# Patient Record
Sex: Female | Born: 1935 | Race: White | Hispanic: No | Smoking: Never smoker
Health system: Southern US, Community
[De-identification: ages and names within clinical notes are randomized; demographics above are authoritative.]

## PROBLEM LIST (undated history)

## (undated) DIAGNOSIS — I495 Sick sinus syndrome: Secondary | ICD-10-CM

## (undated) DIAGNOSIS — R0600 Dyspnea, unspecified: Secondary | ICD-10-CM

## (undated) DIAGNOSIS — E119 Type 2 diabetes mellitus without complications: Secondary | ICD-10-CM

## (undated) DIAGNOSIS — I499 Cardiac arrhythmia, unspecified: Secondary | ICD-10-CM

## (undated) DIAGNOSIS — I639 Cerebral infarction, unspecified: Secondary | ICD-10-CM

## (undated) DIAGNOSIS — Z87898 Personal history of other specified conditions: Secondary | ICD-10-CM

## (undated) DIAGNOSIS — K219 Gastro-esophageal reflux disease without esophagitis: Secondary | ICD-10-CM

## (undated) DIAGNOSIS — R609 Edema, unspecified: Secondary | ICD-10-CM

## (undated) DIAGNOSIS — I10 Essential (primary) hypertension: Secondary | ICD-10-CM

## (undated) HISTORY — PX: SHOULDER ARTHROSCOPY: SHX128

## (undated) HISTORY — PX: BACK SURGERY: SHX140

## (undated) HISTORY — PX: EYE SURGERY: SHX253

---

## 2007-02-15 ENCOUNTER — Emergency Department: Payer: Self-pay | Admitting: Emergency Medicine

## 2009-11-23 ENCOUNTER — Ambulatory Visit: Payer: Self-pay | Admitting: Ophthalmology

## 2009-11-27 ENCOUNTER — Ambulatory Visit: Payer: Self-pay | Admitting: Cardiovascular Disease

## 2009-12-11 ENCOUNTER — Ambulatory Visit: Payer: Self-pay | Admitting: Ophthalmology

## 2011-11-27 ENCOUNTER — Ambulatory Visit: Payer: Self-pay | Admitting: Internal Medicine

## 2012-02-19 ENCOUNTER — Ambulatory Visit: Payer: Self-pay | Admitting: Ophthalmology

## 2012-02-19 DIAGNOSIS — I10 Essential (primary) hypertension: Secondary | ICD-10-CM

## 2012-03-02 ENCOUNTER — Ambulatory Visit: Payer: Self-pay | Admitting: Ophthalmology

## 2012-11-05 ENCOUNTER — Ambulatory Visit: Payer: Self-pay | Admitting: Cardiology

## 2014-10-25 NOTE — Op Note (Signed)
PATIENT NAME:  Misty Trevino, Misty Trevino MR#:  [id number] DATE OF BIRTH:  1921/05/18  DATE OF PROCEDURE:  03/02/2012  PREOPERATIVE DIAGNOSIS: Cataract, right eye.   POSTOPERATIVE DIAGNOSIS:  Cataract, right eye.  PROCEDURE PERFORMED: Extracapsular cataract extraction using phacoemulsification with placement of an Alcon [id number], 24.5-diopter posterior chamber lens, serial [id number].   SURGEON: Loura Back. Gurkirat Basher, M.D.   ANESTHESIA: 4% lidocaine and 0.75% Marcaine in a 50-50 mixture with 10 units/mL of Hylenex added, given as a peribulbar.   ANESTHESIOLOGIST: Dr. Benjamine Mola   COMPLICATIONS: None.   ESTIMATED BLOOD LOSS: Less than 1 mL.   DESCRIPTION OF PROCEDURE:  The patient was brought to the operating room and given a peribulbar block.  The patient was then prepped and draped in the usual fashion.  The vertical rectus muscles were imbricated using 5-0 silk sutures.  These sutures were then clamped to the sterile drapes as bridle sutures.  A limbal peritomy was performed extending two clock hours and hemostasis was obtained with cautery.  A partial thickness scleral groove was made at the surgical limbus and dissected anteriorly in a lamellar dissection using an Alcon crescent knife.  The anterior chamber was entered superonasally with a Superblade and through the lamellar dissection with a 2.6 mm keratome.  DisCoVisc was used to replace the aqueous and a continuous tear capsulorrhexis was carried out.  Hydrodissection and hydrodelineation were carried out with balanced salt and a 27 gauge canula.  The nucleus was rotated to confirm the effectiveness of the hydrodissection.  Phacoemulsification was carried out using a divide-and-conquer technique.  Total ultrasound time was 2 minutes and 2 seconds with an average power of  23.3 percent.  CDE 51.65.  Irrigation/aspiration was used to remove the residual cortex.  DisCoVisc was used to inflate the capsule and the internal incision was enlarged to 3 mm  with the crescent knife.  The intraocular lens was folded and inserted into the capsular bag using the Acrysert delivery system.  Irrigation/aspiration was used to remove the residual DisCoVisc.  Miostat was injected into the anterior chamber through the paracentesis track to inflate the anterior chamber and induce miosis.  The wound was checked for leaks and none were found. The conjunctiva was closed with cautery and the bridle sutures were removed.  Two drops of 0.3% Vigamox were placed on the eye.   An eye shield was placed on the eye.  The patient was discharged to the recovery room in good condition.   ____________________________ Loura Back Alazar Cherian, MD sad:bjt D: 03/02/2012 13:49:53 ET T: 03/02/2012 13:59:56 ET JOB#: [id number]  cc: Remo Lipps A. Reighlynn Swiney, MD, <Dictator> Martie Lee MD ELECTRONICALLY SIGNED 03/06/2012 12:40

## 2015-10-24 ENCOUNTER — Other Ambulatory Visit: Payer: Self-pay | Admitting: Internal Medicine

## 2015-10-24 DIAGNOSIS — G459 Transient cerebral ischemic attack, unspecified: Secondary | ICD-10-CM

## 2015-10-30 ENCOUNTER — Ambulatory Visit
Admission: RE | Admit: 2015-10-30 | Discharge: 2015-10-30 | Disposition: A | Discharge status: Home / Self Care | Payer: Medicare Other | Source: Ambulatory Visit | Attending: Internal Medicine | Admitting: Internal Medicine

## 2015-10-30 DIAGNOSIS — I708 Atherosclerosis of other arteries: Secondary | ICD-10-CM | POA: Insufficient documentation

## 2015-10-30 DIAGNOSIS — G459 Transient cerebral ischemic attack, unspecified: Secondary | ICD-10-CM

## 2016-02-19 ENCOUNTER — Other Ambulatory Visit: Payer: Self-pay | Admitting: Otolaryngology

## 2017-10-29 ENCOUNTER — Other Ambulatory Visit: Payer: Self-pay | Admitting: Internal Medicine

## 2017-10-29 ENCOUNTER — Ambulatory Visit
Admission: RE | Admit: 2017-10-29 | Discharge: 2017-10-29 | Disposition: A | Discharge status: Home / Self Care | Payer: Medicare Other | Source: Ambulatory Visit | Attending: Internal Medicine | Admitting: Internal Medicine

## 2017-10-29 DIAGNOSIS — M85851 Other specified disorders of bone density and structure, right thigh: Secondary | ICD-10-CM | POA: Insufficient documentation

## 2017-10-29 DIAGNOSIS — M25551 Pain in right hip: Secondary | ICD-10-CM | POA: Diagnosis not present

## 2017-10-29 DIAGNOSIS — W19XXXA Unspecified fall, initial encounter: Secondary | ICD-10-CM | POA: Diagnosis not present

## 2017-10-29 DIAGNOSIS — R52 Pain, unspecified: Secondary | ICD-10-CM

## 2017-10-29 DIAGNOSIS — M160 Bilateral primary osteoarthritis of hip: Secondary | ICD-10-CM | POA: Diagnosis not present

## 2018-02-19 ENCOUNTER — Encounter
Admission: RE | Admit: 2018-02-19 | Discharge: 2018-02-19 | Disposition: A | Discharge status: Home / Self Care | Payer: Medicare Other | Source: Ambulatory Visit | Attending: Cardiology | Admitting: Cardiology

## 2018-02-19 ENCOUNTER — Ambulatory Visit
Admission: RE | Admit: 2018-02-19 | Discharge: 2018-02-19 | Disposition: A | Discharge status: Home / Self Care | Payer: Medicare Other | Source: Ambulatory Visit | Attending: Cardiology | Admitting: Cardiology

## 2018-02-19 ENCOUNTER — Other Ambulatory Visit: Payer: Self-pay

## 2018-02-19 DIAGNOSIS — Z01818 Encounter for other preprocedural examination: Secondary | ICD-10-CM | POA: Insufficient documentation

## 2018-02-19 DIAGNOSIS — I480 Paroxysmal atrial fibrillation: Secondary | ICD-10-CM | POA: Diagnosis not present

## 2018-02-19 DIAGNOSIS — J984 Other disorders of lung: Secondary | ICD-10-CM | POA: Diagnosis not present

## 2018-02-19 DIAGNOSIS — I700 Atherosclerosis of aorta: Secondary | ICD-10-CM | POA: Diagnosis not present

## 2018-02-19 DIAGNOSIS — M810 Age-related osteoporosis without current pathological fracture: Secondary | ICD-10-CM | POA: Diagnosis not present

## 2018-02-19 HISTORY — DX: Dyspnea, unspecified: R06.00

## 2018-02-19 HISTORY — DX: Cerebral infarction, unspecified: I63.9

## 2018-02-19 HISTORY — DX: Personal history of other specified conditions: Z87.898

## 2018-02-19 HISTORY — DX: Edema, unspecified: R60.9

## 2018-02-19 HISTORY — DX: Gastro-esophageal reflux disease without esophagitis: K21.9

## 2018-02-19 HISTORY — DX: Type 2 diabetes mellitus without complications: E11.9

## 2018-02-19 HISTORY — DX: Essential (primary) hypertension: I10

## 2018-02-19 HISTORY — DX: Cardiac arrhythmia, unspecified: I49.9

## 2018-02-19 LAB — BASIC METABOLIC PANEL
ANION GAP: 11 (ref 5–15)
BUN: 41 mg/dL — AB (ref 8–23)
CO2: 26 mmol/L (ref 22–32)
Calcium: 9.6 mg/dL (ref 8.9–10.3)
Chloride: 102 mmol/L (ref 98–111)
Creatinine, Ser: 1.14 mg/dL — ABNORMAL HIGH (ref 0.44–1.00)
GFR calc Af Amer: 46 mL/min — ABNORMAL LOW (ref >60)
GFR calc non Af Amer: 40 mL/min — ABNORMAL LOW (ref >60)
GLUCOSE: 191 mg/dL — AB (ref 70–99)
Potassium: 4.6 mmol/L (ref 3.5–5.1)
Sodium: 139 mmol/L (ref 135–145)

## 2018-02-19 LAB — APTT: APTT: 35 s (ref 24–36)

## 2018-02-19 LAB — CBC
HEMATOCRIT: 40.9 % (ref 35.0–47.0)
Hemoglobin: 14 g/dL (ref 12.0–16.0)
MCH: 33.7 pg (ref 26.0–34.0)
MCHC: 34.3 g/dL (ref 32.0–36.0)
MCV: 98.2 fL (ref 80.0–100.0)
Platelets: 309 10*3/uL (ref 150–440)
RBC: 4.16 MIL/uL (ref 3.80–5.20)
RDW: 14 % (ref 11.5–14.5)
WBC: 13.4 10*3/uL — AB (ref 3.6–11.0)

## 2018-02-19 LAB — PROTIME-INR
INR: 1.3
Prothrombin Time: 16.1 seconds — ABNORMAL HIGH (ref 11.4–15.2)

## 2018-02-19 LAB — SURGICAL PCR SCREEN
MRSA, PCR: NEGATIVE
Staphylococcus aureus: POSITIVE — AB

## 2018-02-19 NOTE — Patient Instructions (Signed)
Your procedure is scheduled on:02/25/18 Report to Day Surgery. [facility] second floor To find out your arrival time please call [phone] between 1PM - 3PM on 02/24/18 Remember: Instructions that are not followed completely may result in serious medical risk,  up to and including death, or upon the discretion of your surgeon and anesthesiologist your  surgery may need to be rescheduled.     _X__ 1. Do not eat food after midnight the night before your procedure.                 No gum chewing or hard candies. You may drink clear liquids up to 2 hours                 before you are scheduled to arrive for your surgery- DO not drink clear                 liquids within 2 hours of the start of your surgery.                 Clear Liquids include:  water, apple juice without pulp, clear carbohydrate                 drink such as Clearfast of Gatorade, Black Coffee or Tea (Do not add                 anything to coffee or tea).  __X__2.  On the morning of surgery brush your teeth with toothpaste and water, you                may rinse your mouth with mouthwash if you wish.  Do not swallow any toothpaste of mouthwash.     _X__ 3.  No Alcohol for 24 hours before or after surgery.   _X__ 4.  Do Not Smoke or use e-cigarettes For 24 Hours Prior to Your Surgery.                 Do not use any chewable tobacco products for at least 6 hours prior to                 surgery.  ____  5.  Bring all medications with you on the day of surgery if instructed.   _X  6.  Notify your doctor if there is any change in your medical condition      (cold, fever, infections).     Do not wear jewelry, make-up, hairpins, clips or nail polish. Do not wear lotions, powders, or perfumes. You may wear deodorant. Do not shave 48 hours prior to surgery. Men may shave face and neck. Do not bring valuables to the hospital.    [facility] is not responsible for any belongings or  valuables.  Contacts, dentures or bridgework may not be worn into surgery. Leave your suitcase in the car. After surgery it may be brought to your room. For patients admitted to the hospital, discharge time is determined by your treatment team.   Patients discharged the day of surgery will not be allowed to drive home.   Please read over the following fact sheets that you were given:   Surgical Site Infection Prevention /MRSA  __X__ Take these medicines the morning of surgery with A SIP OF WATER:    1.NONE  2.   3.   4.  5.  6.  ____ Fleet Enema (as directed)   _X Use CHG Soap as directed  ____ Use inhalers  on the day of surgery  __X__ Stop metformin 2 days prior to surgery LAST DOSE OF METFORMIN Sunday NIGHT   ____ Take 1/2 of usual insulin dose the night before surgery. No insulin the morning          of surgery.   _X___ Stop Coumadin/Plavix/aspirin on  LAST DOSE OF ELIQUIS SATURDAY ____ Stop Anti-inflammatories on    ____ Stop supplements until after surgery.    ____ Bring C-Pap to the hospital.

## 2018-02-20 NOTE — Pre-Procedure Instructions (Signed)
Faxed notification of +Staph - MRSA to office.

## 2018-02-24 MED ORDER — CEFAZOLIN SODIUM-DEXTROSE 1-4 GM/50ML-% IV SOLN
1.0000 g | Freq: Once | INTRAVENOUS | Status: AC
  Administered 2018-02-25: 2 g via INTRAVENOUS

## 2018-02-25 ENCOUNTER — Observation Stay: Payer: Medicare Other

## 2018-02-25 ENCOUNTER — Observation Stay
Admission: RE | Admit: 2018-02-25 | Discharge: 2018-02-26 | Disposition: A | Discharge status: Home / Self Care | Payer: Medicare Other | Source: Ambulatory Visit | Attending: Cardiology | Admitting: Cardiology

## 2018-02-25 ENCOUNTER — Ambulatory Visit: Payer: Medicare Other | Admitting: Certified Registered"

## 2018-02-25 ENCOUNTER — Encounter
Admission: RE | Disposition: A | Discharge status: Home / Self Care | Payer: Self-pay | Source: Ambulatory Visit | Attending: Cardiology

## 2018-02-25 ENCOUNTER — Ambulatory Visit: Payer: Medicare Other

## 2018-02-25 ENCOUNTER — Encounter: Payer: Self-pay | Admitting: *Deleted

## 2018-02-25 ENCOUNTER — Other Ambulatory Visit: Payer: Self-pay

## 2018-02-25 DIAGNOSIS — Z7984 Long term (current) use of oral hypoglycemic drugs: Secondary | ICD-10-CM | POA: Diagnosis not present

## 2018-02-25 DIAGNOSIS — Z8673 Personal history of transient ischemic attack (TIA), and cerebral infarction without residual deficits: Secondary | ICD-10-CM | POA: Insufficient documentation

## 2018-02-25 DIAGNOSIS — I495 Sick sinus syndrome: Principal | ICD-10-CM | POA: Insufficient documentation

## 2018-02-25 DIAGNOSIS — I10 Essential (primary) hypertension: Secondary | ICD-10-CM | POA: Insufficient documentation

## 2018-02-25 DIAGNOSIS — I480 Paroxysmal atrial fibrillation: Secondary | ICD-10-CM | POA: Diagnosis not present

## 2018-02-25 DIAGNOSIS — I441 Atrioventricular block, second degree: Secondary | ICD-10-CM | POA: Diagnosis not present

## 2018-02-25 DIAGNOSIS — Z79899 Other long term (current) drug therapy: Secondary | ICD-10-CM | POA: Diagnosis not present

## 2018-02-25 DIAGNOSIS — Z7901 Long term (current) use of anticoagulants: Secondary | ICD-10-CM | POA: Insufficient documentation

## 2018-02-25 DIAGNOSIS — Z8249 Family history of ischemic heart disease and other diseases of the circulatory system: Secondary | ICD-10-CM | POA: Diagnosis not present

## 2018-02-25 DIAGNOSIS — E119 Type 2 diabetes mellitus without complications: Secondary | ICD-10-CM | POA: Diagnosis not present

## 2018-02-25 DIAGNOSIS — E785 Hyperlipidemia, unspecified: Secondary | ICD-10-CM | POA: Insufficient documentation

## 2018-02-25 DIAGNOSIS — Z950 Presence of cardiac pacemaker: Secondary | ICD-10-CM

## 2018-02-25 HISTORY — DX: Sick sinus syndrome: I49.5

## 2018-02-25 HISTORY — PX: PACEMAKER INSERTION: SHX728

## 2018-02-25 LAB — GLUCOSE, CAPILLARY
GLUCOSE-CAPILLARY: 126 mg/dL — AB (ref 70–99)
Glucose-Capillary: 154 mg/dL — ABNORMAL HIGH (ref 70–99)
Glucose-Capillary: 211 mg/dL — ABNORMAL HIGH (ref 70–99)

## 2018-02-25 SURGERY — INSERTION, CARDIAC PACEMAKER
Anesthesia: Choice | Wound class: Clean

## 2018-02-25 MED ORDER — SODIUM CHLORIDE 0.9 % IV SOLN
INTRAVENOUS | Status: DC
  Administered 2018-02-25 (×2): via INTRAVENOUS

## 2018-02-25 MED ORDER — ONDANSETRON HCL 4 MG/2ML IJ SOLN: 4.0000 mg | Freq: Four times a day (QID) | INTRAMUSCULAR | Status: DC | PRN

## 2018-02-25 MED ORDER — LIDOCAINE 1 % OPTIME INJ - NO CHARGE
INTRAMUSCULAR | Status: DC | PRN
  Administered 2018-02-25: 30 mL

## 2018-02-25 MED ORDER — PROPOFOL 10 MG/ML IV BOLUS
INTRAVENOUS | Status: AC
  Filled 2018-02-25: qty 20

## 2018-02-25 MED ORDER — PROPOFOL 500 MG/50ML IV EMUL
INTRAVENOUS | Status: DC | PRN
  Administered 2018-02-25: 25 ug/kg/min via INTRAVENOUS

## 2018-02-25 MED ORDER — CEFAZOLIN SODIUM-DEXTROSE 1-4 GM/50ML-% IV SOLN
1.0000 g | Freq: Four times a day (QID) | INTRAVENOUS | Status: DC
  Administered 2018-02-25 – 2018-02-26 (×2): 1 g via INTRAVENOUS
  Filled 2018-02-25 (×4): qty 50

## 2018-02-25 MED ORDER — FAMOTIDINE 20 MG PO TABS
ORAL_TABLET | ORAL | Status: AC
  Filled 2018-02-25: qty 1

## 2018-02-25 MED ORDER — METOPROLOL SUCCINATE ER 50 MG PO TB24
50.0000 mg | ORAL_TABLET | Freq: Every day | ORAL | Status: DC
  Administered 2018-02-25 – 2018-02-26 (×2): 50 mg via ORAL
  Filled 2018-02-25: qty 2
  Filled 2018-02-25: qty 1

## 2018-02-25 MED ORDER — FAMOTIDINE 20 MG PO TABS
20.0000 mg | ORAL_TABLET | Freq: Once | ORAL | Status: AC
  Administered 2018-02-25: 20 mg via ORAL

## 2018-02-25 MED ORDER — ACETAMINOPHEN 325 MG PO TABS
325.0000 mg | ORAL_TABLET | ORAL | Status: DC | PRN
  Administered 2018-02-26: 650 mg via ORAL
  Filled 2018-02-25: qty 2

## 2018-02-25 MED ORDER — METFORMIN HCL 850 MG PO TABS
850.0000 mg | ORAL_TABLET | Freq: Every day | ORAL | Status: DC
  Administered 2018-02-26: 850 mg via ORAL
  Filled 2018-02-25: qty 1

## 2018-02-25 MED ORDER — CEFAZOLIN SODIUM-DEXTROSE 1-4 GM/50ML-% IV SOLN
INTRAVENOUS | Status: AC
  Filled 2018-02-25: qty 50

## 2018-02-25 MED ORDER — SODIUM CHLORIDE 0.9 % IV SOLN
INTRAVENOUS | Status: DC
  Administered 2018-02-25: 13:00:00 via INTRAVENOUS

## 2018-02-25 MED ORDER — SODIUM CHLORIDE 0.9 % IV SOLN
Freq: Once | INTRAVENOUS | Status: DC
  Filled 2018-02-25: qty 2

## 2018-02-25 MED ORDER — FENTANYL CITRATE (PF) 100 MCG/2ML IJ SOLN: 25.0000 ug | INTRAMUSCULAR | Status: DC | PRN

## 2018-02-25 MED ORDER — METOPROLOL TARTRATE 5 MG/5ML IV SOLN
INTRAVENOUS | Status: DC | PRN
  Administered 2018-02-25: 2.5 mg via INTRAVENOUS

## 2018-02-25 MED ORDER — ONDANSETRON HCL 4 MG/2ML IJ SOLN: 4.0000 mg | Freq: Once | INTRAMUSCULAR | Status: DC | PRN

## 2018-02-25 SURGICAL SUPPLY — 37 items
BAG DECANTER FOR FLEXI CONT (MISCELLANEOUS) ×3 IMPLANT
BRUSH SCRUB EZ  4% CHG (MISCELLANEOUS) ×2
BRUSH SCRUB EZ 4% CHG (MISCELLANEOUS) ×1 IMPLANT
CABLE SURG 12 DISP A/V CHANNEL (MISCELLANEOUS) ×3 IMPLANT
CANISTER SUCT 1200ML W/VALVE (MISCELLANEOUS) ×3 IMPLANT
CHLORAPREP W/TINT 26ML (MISCELLANEOUS) ×3 IMPLANT
COVER LIGHT HANDLE STERIS (MISCELLANEOUS) ×6 IMPLANT
COVER MAYO STAND STRL (DRAPES) ×3 IMPLANT
DRAPE C-ARM XRAY 36X54 (DRAPES) ×3 IMPLANT
DRSG TEGADERM 4X4.75 (GAUZE/BANDAGES/DRESSINGS) ×3 IMPLANT
DRSG TELFA 4X3 1S NADH ST (GAUZE/BANDAGES/DRESSINGS) ×3 IMPLANT
ELECT REM PT RETURN 9FT ADLT (ELECTROSURGICAL) ×3
ELECTRODE REM PT RTRN 9FT ADLT (ELECTROSURGICAL) ×1 IMPLANT
GLOVE BIO SURGEON STRL SZ7.5 (GLOVE) ×3 IMPLANT
GLOVE BIO SURGEON STRL SZ8 (GLOVE) ×3 IMPLANT
GOWN STRL REUS W/ TWL LRG LVL3 (GOWN DISPOSABLE) ×1 IMPLANT
GOWN STRL REUS W/ TWL XL LVL3 (GOWN DISPOSABLE) ×1 IMPLANT
GOWN STRL REUS W/TWL LRG LVL3 (GOWN DISPOSABLE) ×2
GOWN STRL REUS W/TWL XL LVL3 (GOWN DISPOSABLE) ×2
IMMOBILIZER SHDR MD LX WHT (SOFTGOODS) ×3 IMPLANT
IMMOBILIZER SHDR XL LX WHT (SOFTGOODS) IMPLANT
INTRO PACEMAKR LEAD 9FR 13CM (INTRODUCER) ×3
INTRO PACEMKR SHEATH II 7FR (MISCELLANEOUS) ×6
INTRODUCER PACEMKR LD 9FR 13CM (INTRODUCER) ×1 IMPLANT
INTRODUCER PACEMKR SHTH II 7FR (MISCELLANEOUS) ×2 IMPLANT
IPG PACE AZUR XT DR MRI W1DR01 (Pacemaker) ×1 IMPLANT
IV NS 500ML (IV SOLUTION) ×2
IV NS 500ML BAXH (IV SOLUTION) ×1 IMPLANT
KIT TURNOVER KIT A (KITS) ×3 IMPLANT
LABEL OR SOLS (LABEL) ×3 IMPLANT
LEAD CAPSURE NOVUS 5076-52CM (Lead) ×3 IMPLANT
MARKER SKIN DUAL TIP RULER LAB (MISCELLANEOUS) ×3 IMPLANT
PACE AZURE XT DR MRI W1DR01 (Pacemaker) ×3 IMPLANT
PACEMAKER LEAD ATRL (Lead) ×3 IMPLANT
PACK PACE INSERTION (MISCELLANEOUS) ×3 IMPLANT
PAD ONESTEP ZOLL R SERIES ADT (MISCELLANEOUS) ×3 IMPLANT
SUT SILK 0 SH 30 (SUTURE) ×9 IMPLANT

## 2018-02-25 NOTE — Transfer of Care (Signed)
Immediate Anesthesia Transfer of Care Note  Patient: Misty Trevino  Procedure(s) Performed: INSERTION PACEMAKER- INITIAL DUAL CHAMBER (N/A )  Patient Location: PACU  Anesthesia Type:MAC  Level of Consciousness: awake, alert  and oriented  Airway & Oxygen Therapy: Patient Spontanous Breathing  Post-op Assessment: Report given to RN and Post -op Vital signs reviewed and stable  Post vital signs: Reviewed and stable  Last Vitals:  Vitals Value Taken Time  BP    Temp    Pulse    Resp    SpO2      Last Pain:  Vitals:   02/25/18 1215  TempSrc: Oral  PainSc: 0-No pain         Complications: No apparent anesthesia complications

## 2018-02-25 NOTE — Anesthesia Post-op Follow-up Note (Signed)
Anesthesia QCDR form completed.        

## 2018-02-25 NOTE — H&P (Signed)
Jump to Section ? Discontinued MedicationsDocument InformationEncounter DetailsLast Filed Vital SignsPatient ContactsPatient DemographicsPatient InstructionsPlan of TreatmentProgress NotesReason for VisitSocial HistoryVisit Diagnoses [name] Encounter Summary, generated on Aug. 21, 2019August 21, 2019 Printout Information  Document Contents Document Received Date Document Source Organization  Office Visit Aug. 21, 2019August 21, 2019 [name]  Patient Demographics - 84 y.o. Female; born [birth year]   Patient Address Communication Language Race / Ethnicity Marital Status  [place], [place] [address] [phone] (Home) [email or web address] English (Preferred) White / Not Hispanic or Latino Widowed  Reason for Visit   Reason Comments  Follow-up holter-HR low  Encounter Details   Date Type Department Care Team Description  02/16/2018 Office Visit [facility]  [place], [place] [address]  [phone]  Misty Trevino, [place]  [facility]  [place], [place] [address]  [phone]  [phone] (Fax)  Paroxysmal atrial fibrillation (CMS-HCC) (Primary Dx);  Essential (primary) hypertension;  Type 2 diabetes mellitus without complication, without long-term current use of insulin (CMS-HCC);  Bradycardia;  Mobitz type 2 second degree atrioventricular block  Social History - documented as of this encounter  Tobacco Use Types Packs/Day Years Used Date  Never Smoker      Smokeless Tobacco: Never Used      Alcohol Use Drinks/Week oz/Week Comments  No      Sex Assigned at [employer] Date Recorded  Not on file    Job Start Date Occupation Industry  Not on file Not on file Not on file   Travel History Travel Start Travel End  No recent travel history available.    Last Filed Vital Signs - documented in this encounter  Vital Sign Reading Time Taken  Comments  Blood Pressure 120/60 02/16/2018 3:29 PM EDT   Pulse 39 02/16/2018 3:29 PM EDT   Temperature - -   Respiratory Rate - -   Oxygen Saturation 96% 02/16/2018 3:29 PM EDT   Inhaled Oxygen Concentration - -   Weight 54.9 kg (121 lb) 02/16/2018 3:29 PM EDT   Height 160 cm (5\' 3" ) 02/16/2018 3:29 PM EDT   Body Mass Index 21.43 02/16/2018 3:29 PM EDT   Patient Instructions - documented in this encounter  Patient Instructions Misty Cowman, MD - 02/16/2018 2:30 PM EDT   Patient Education    DASH Diet: Care Instructions Your Care Instructions  The DASH diet is an eating plan that can help lower your blood pressure. DASH stands for Dietary Approaches to Stop Hypertension. Hypertension is high blood pressure. The DASH diet focuses on eating foods that are high in calcium, potassium, and magnesium. These nutrients can lower blood pressure. The foods that are highest in these nutrients are fruits, vegetables, low-fat dairy products, nuts, seeds, and legumes. But taking calcium, potassium, and magnesium supplements instead of eating foods that are high in those nutrients does not have the same effect. The DASH diet also includes whole grains, fish, and poultry. The DASH diet is one of several lifestyle changes your doctor may recommend to lower your high blood pressure. Your doctor may also want you to decrease the amount of sodium in your diet. Lowering sodium while following the DASH diet can lower blood pressure even further than just the DASH diet alone. Follow-up care is a key part of your treatment and safety. Be sure to make and go to all appointments, and call your doctor if you are having problems. It's also a good  idea to know your test results and keep a list of the medicines you take. How can you care for yourself at home? Following the DASH diet  Eat 4 to 5 servings of fruit each day. A serving is 1 medium-sized piece of fruit,  cup chopped or canned fruit, 1/4  cup dried fruit, or 4 ounces ( cup) of fruit juice. Choose fruit more often than fruit juice.  Eat 4 to 5 servings of vegetables each day. A serving is 1 cup of lettuce or raw leafy vegetables,  cup of chopped or cooked vegetables, or 4 ounces ( cup) of vegetable juice. Choose vegetables more often than vegetable juice.  Get 2 to 3 servings of low-fat and fat-free dairy each day. A serving is 8 ounces of milk, 1 cup of yogurt, or 1  ounces of cheese.  Eat 6 to 8 servings of grains each day. A serving is 1 slice of bread, 1 ounce of dry cereal, or  cup of cooked rice, pasta, or cooked cereal. Try to choose whole-grain products as much as possible.  Limit lean meat, poultry, and fish to 2 servings each day. A serving is 3 ounces, about the size of a deck of cards.  Eat 4 to 5 servings of nuts, seeds, and legumes (cooked dried beans, lentils, and split peas) each week. A serving is 1/3 cup of nuts, 2 tablespoons of seeds, or  cup of cooked beans or peas.  Limit fats and oils to 2 to 3 servings each day. A serving is 1 teaspoon of vegetable oil or 2 tablespoons of salad dressing.  Limit sweets and added sugars to 5 servings or less a week. A serving is 1 tablespoon jelly or jam,  cup sorbet, or 1 cup of lemonade.  Eat less than 2,300 milligrams (mg) of sodium a day. If you limit your sodium to 1,500 mg a day, you can lower your blood pressure even more. Tips for success  Start small. Do not try to make dramatic changes to your diet all at once. You might feel that you are missing out on your favorite foods and then be more likely to not follow the plan. Make small changes, and stick with them. Once those changes become habit, add a few more changes.  Try some of the following: ? Make it a goal to eat a fruit or vegetable at every meal and at snacks. This will make it easy to get the recommended amount of fruits and vegetables each day. ? Try yogurt topped with fruit and nuts for a snack or  healthy dessert. ? Add lettuce, tomato, cucumber, and onion to sandwiches. ? Combine a ready-made pizza crust with low-fat mozzarella cheese and lots of vegetable toppings. Try using tomatoes, squash, spinach, broccoli, carrots, cauliflower, and onions. ? Have a variety of cut-up vegetables with a low-fat dip as an appetizer instead of chips and dip. ? Sprinkle sunflower seeds or chopped almonds over salads. Or try adding chopped walnuts or almonds to cooked vegetables. ? Try some vegetarian meals using beans and peas. Add garbanzo or kidney beans to salads. Make burritos and tacos with mashed pinto beans or black beans. Where can you learn more? Log in to your [facility] MyChart account at [email or web address] and click on top menu option "Health" then select "Search Medical Library". Enter [phone] in the search box and click the magnify glass to learn more about "DASH Diet: Care Instructions." Current as of: January 26, 2017 Content Version: 12.1  2006-2019  Healthwise, Incorporated. Care instructions adapted under license by your healthcare professional. If you have questions about a medical condition or this instruction, always ask your healthcare professional. [place] any warranty or liability for your use of this information.     Patient Education    Learning About High Cholesterol What is high cholesterol?  Cholesterol is a type of fat in your blood. It is needed for many body functions, such as making new cells. Cholesterol is made by your body. It also comes from food you eat. If you have too much cholesterol, it starts to build up in your arteries. This is called hardening of the arteries, or atherosclerosis. High cholesterol raises your risk of a heart attack and stroke. There are different types of cholesterol. LDL is the "bad" cholesterol. High LDL can raise your risk for heart disease, heart attack, and stroke. HDL is the "good" cholesterol. High HDL is  linked with a lower risk for heart disease, heart attack, and stroke. Your cholesterol levels help your doctor find out your risk for having a heart attack or stroke. How can you prevent high cholesterol? A heart-healthy lifestyle can help you prevent high cholesterol. This lifestyle helps lower your risk for a heart attack and stroke.  Eat heart-healthy foods. ? Eat fruits, vegetables, whole grains (like oatmeal), dried beans and peas, nuts and seeds, soy products (like tofu), and fat-free or low-fat dairy products. ? Replace butter, margarine, and hydrogenated or partially hydrogenated oils with olive and canola oils. (Canola oil margarine without trans fat is fine.) ? Replace red meat with fish, poultry, and soy protein (like tofu). ? Limit processed and packaged foods like chips, crackers, and cookies.  Be active. Exercise can improve your cholesterol level. Get at least 30 minutes of exercise on most days of the week. Walking is a good choice. You also may want to do other activities, such as running, swimming, cycling, or playing tennis or team sports.  Stay at a healthy weight. Lose weight if you need to.  Don't smoke. If you need help quitting, talk to your doctor about stop-smoking programs and medicines. These can increase your chances of quitting for good. How is high cholesterol treated? The goal of treatment is to reduce your chances of having a heart attack or stroke. The goal is not to lower your cholesterol numbers only.  You may make lifestyle changes, such as eating healthy foods, not smoking, losing weight, and being more active.  You may have to take medicine. Follow-up care is a key part of your treatment and safety. Be sure to make and go to all appointments, and call your doctor if you are having problems. It's also a good idea to know your test results and keep a list of the medicines you take. Where can you learn more? Log in to your [facility] MyChart account at  [email or web address] and click on top menu option "Health" then select "Search Medical Library". Enter [phone] in the search box and click the magnify glass to learn more about "Learning About High Cholesterol." Current as of: January 26, 2017 Content Version: 12.1  2006-2019 Healthwise, Incorporated. Care instructions adapted under license by your healthcare professional. If you have questions about a medical condition or this instruction, always ask your healthcare professional. [place] any warranty or liability for your use of this information.      Electronically signed by Misty Cowman, MD at 02/16/2018 4:27 PM EDT    Progress Notes - documented in  this encounter  Misty Cowman, MD - 02/16/2018 2:30 PM EDT Formatting of this note might be different from the original. Established Patient Visit   Chief Complaint: Chief Complaint  Patient presents with  . Follow-up  holter-HR low  Date of Service: 02/16/2018 Date of Birth: July 30, 1920 PCP: Albina Billet, MD  History of Present Illness: Ms. Misty Trevino is a 84 y.o.female patient who returns  1. Paroxysmal atrial fibrillation 2. Essential hypertension 3. Hyperlipidemia 4. TIA 5. Type 2 diabetes 6. Mobitz type II second-degree AV block  Patient was seen on 02/11/2018 for bradycardia. ECG revealed 2-1 AV conduction. Metoprolol was discontinued. The patient has had no significant clinical improvement. She denies chest pain. She has exertional dyspnea. She denies palpitations or heart racing. She denies peripheral edema. She denies presyncope or syncope. She does have a easy fatigability. She ambulates with a cane and walker. 24-hour Holter monitor revealed predominant Mobitz type II second-degree AV block with mean heart rate of 41 bpm, heart rate ranged from 31 to 64 bpm. There was one transient episode of AV dissociation. 2D echocardiogram 11/10/2015 revealed LVEF of 50% with moderate mitral and  tricuspid regurgitation.  The patient has paroxysmal atrial fibrillation, on low-dose Eliquis for stroke prevention, which is tolerated well without bleeding side effects.  The patient has essential hypertension, blood pressure well controlled, currently on metoprolol succinate, which is well tolerated without apparent side effects. The patient follows a low-sodium, no added salt diet.  The patient has hyperlipidemia, no longer on simvastatin, on diet therapy. The patient follows a low-cholesterol, low-fat diet.  Past Medical and Surgical History  Past Medical History Past Medical History:  Diagnosis Date  . Diabetes mellitus type 2, uncomplicated (CMS-HCC)  . GERD (gastroesophageal reflux disease)  . Hyperlipidemia  . Hypertension   Past Surgical History She has a past surgical history that includes back surgery.   Medications and Allergies  Current Medications  Current Outpatient Medications  Medication Sig Dispense Refill  . allopurinol (ZYLOPRIM) 100 MG tablet  . apixaban (ELIQUIS) 2.5 mg tablet Take 1 tablet (2.5 mg total) by mouth every 12 (twelve) hours 180 tablet 3  . carboxymethylcellulose (REFRESH TEARS) 0.5 % ophthalmic solution Administer 2 drops into the left eye 4 (four) times a day as needed (eye drynwess).  Marland Kitchen ELIQUIS 2.5 mg tablet TAKE 1 TABLET BY MOUTH TWICE DAILY 60 tablet 6  . metFORMIN (GLUCOPHAGE) 850 MG tablet daily with breakfast  . metoprolol succinate (TOPROL-XL) 50 MG XL tablet Take 1 tablet (50 mg total) by mouth once daily 30 tablet 11  . PREVNAR 13, PF, IM syringe  . triamcinolone 0.1 % cream   No current facility-administered medications for this visit.   Allergies: Glimepiride  Social and Family History  Social History reports that she has never smoked. She has never used smokeless tobacco. She reports that she does not drink alcohol or use drugs.  Family History Family History  Problem Relation Age of Onset  . Myocardial Infarction (Heart  attack) Father   Review of Systems   Review of Systems: The patient denies chest pain, reports chronic exertional shortness of breath, orthopnea, paroxysmal nocturnal dyspnea, with intermittent pedal edema, without palpitations, heart racing, presyncope, syncope. Review of 12 Systems is negative except as described above.  Physical Examination   Vitals: BP 120/60  Pulse (!) 39  Ht 160 cm (5\' 3" )  Wt 54.9 kg (121 lb)  SpO2 96%  BMI 21.43 kg/m  Ht:160 cm (5\' 3" ) Wt:54.9 kg (121  lb) DZH:GDJM surface area is 1.56 meters squared. Body mass index is 21.43 kg/m.  HEENT: Pupils equally reactive to light and accomodation  Neck: Supple without thyromegaly, carotid pulses 2+ Lungs: clear to auscultation bilaterally; no wheezes, rales, rhonchi Heart: irregularly irregular. No gallops, murmurs or rub Abdomen: soft nontender, nondistended, with normal bowel sounds Extremities: no cyanosis, clubbing; 1+ edema Peripheral Pulses: 2+ in all extremities, 2+ femoral pulses bilaterally  Assessment   84 y.o. female with  1. Paroxysmal atrial fibrillation (CMS-HCC)  2. Essential (primary) hypertension  3. Type 2 diabetes mellitus without complication, without long-term current use of insulin (CMS-HCC)  4. Bradycardia  5. Mobitz type 2 second degree atrioventricular block   84 year old female with recently diagnosed paroxysmal atrial fibrillation. The patient has a chads vas score of 7, on low-dose Eliquis, for stroke prevention. Carotid ultrasound revealed insignificant stenosis. 2D echocardiogram revealed normal left ventricular function with moderate valvular abnormalities. The patient was seen on 02/11/2018 when she was noted to be bradycardic by her primary care provider. Metoprolol succinate was discontinued. 24-hour Holter monitor reveals predominant Mobitz type II second-degree AV block with a mean heart rate of 41 bpm, hemodynamically stable, without presyncope or syncope. We had a lengthy  discussion, about the risks, benefits and alternatives of permanent pacemaker implantation. The patient is somewhat hesitant to proceed at this time, and the daughter wishes to talk further with other family members.  Plan   1. Continue current medications 2. Continue Eliquis 2.5 mg twice daily 3. Counseled patient about low-sodium diet 4. DASH diet printed instructions given to the patient 5. Counseled patient about low-cholesterol diet 6. Low-fat and cholesterol diet printed instructions given to the patient 7. Stop metoprolol succinate 8. Return to clinic for follow-up in 3 weeks to further discuss permanent pacemaker implantation. Instructed patient and patient's daughter to return sooner if they decide to proceed with pacemaker implantation prior to follow-up visit.  No orders of the defined types were placed in this encounter.  Return in about 3 weeks (around 03/09/2018).  Misty Cowman, MD PhD [facility]    Electronically signed by Misty Cowman, MD at 02/16/2018 4:54 PM EDT  Plan of Treatment - documented as of this encounter  Upcoming Encounters Upcoming Encounters  Date Type Specialty Care Team Description  03/05/2018 Office Visit Cardiology Jairon Ripberger, Sheppard Coil, MD  [place]  [facility]  [place], [place] [address]  [phone]  [phone] ([phone]    04/03/2018 Office Visit Cardiology Misty Trevino, [place]  [facility]  [place], [place] [address]  [phone]  [phone] (Fax)    Visit Diagnoses - documented in this encounter  Diagnosis  Paroxysmal atrial fibrillation (CMS-HCC) - Primary  Atrial fibrillation   Essential (primary) hypertension  Unspecified essential hypertension   Type 2 diabetes mellitus without complication, without long-term current use of insulin (CMS-HCC)   Bradycardia  Other specified cardiac dysrhythmias   Mobitz type 2 second degree  atrioventricular block  Mobitz (type) II atrioventricular block   Discontinued Medications - documented as of this encounter  Medication Sig Discontinue Reason Start Date End Date  metoprolol succinate (TOPROL-XL) 50 MG XL tablet  Take 1 tablet (50 mg total) by mouth once daily No longer indicated 09/19/2017 02/16/2018  Images Patient Contacts   Contact Name Contact Address Communication Relationship to Patient  Ethlyn Gallery Unknown [phone] [facility]) Son or Daughter, Emergency [place] Unknown [phone] (Mobile) Son or Daughter, Emergency Contact  Document Information  Primary Care  Provider Other Service Providers Document Coverage Dates  Albina Billet, MD (Feb. 09, 2017February 09, 2017 - Present) DM: [id number] [phone] (Work) [phone] (Fax) [place] [address] [place], [place] [address] Internal Medicine  Aug. 12, 2019August 12, 2019   [place] [address] [place], [place] [address]   Encounter Providers Encounter Date  Misty Cowman, MD (Attending) [phone] (Work) [phone] (Fax) [address] [facility] [place], [place] [address] Cardiovascular Disease Aug. 12, 2019August 12, 2019    Show All Sections

## 2018-02-25 NOTE — Anesthesia Preprocedure Evaluation (Signed)
Anesthesia Evaluation  Patient identified by MRN, date of birth, ID band Patient awake    Reviewed: Allergy & Precautions, H&P , NPO status , Patient's Chart, lab work & pertinent test results, reviewed documented beta blocker date and time   Airway Mallampati: II  TM Distance: >3 FB Neck ROM: full    Dental no notable dental hx. (+) Teeth Intact   Pulmonary neg pulmonary ROS, shortness of breath and with exertion,    Pulmonary exam normal breath sounds clear to auscultation       Cardiovascular Exercise Tolerance: Poor hypertension, + Orthopnea  negative cardio ROS  + dysrhythmias  Rhythm:regular Rate:Normal     Neuro/Psych CVA, Residual Symptoms negative neurological ROS  negative psych ROS   GI/Hepatic negative GI ROS, Neg liver ROS, GERD  ,  Endo/Other  negative endocrine ROSdiabetes  Renal/GU      Musculoskeletal   Abdominal   Peds  Hematology negative hematology ROS (+)   Anesthesia Other Findings   Reproductive/Obstetrics negative OB ROS                             Anesthesia Physical Anesthesia Plan  ASA: IV  Anesthesia Plan: MAC   Post-op Pain Management:    Induction:   PONV Risk Score and Plan:   Airway Management Planned:   Additional Equipment:   Intra-op Plan:   Post-operative Plan:   Informed Consent: I have reviewed the patients History and Physical, chart, labs and discussed the procedure including the risks, benefits and alternatives for the proposed anesthesia with the patient or authorized representative who has indicated his/her understanding and acceptance.     Plan Discussed with: CRNA  Anesthesia Plan Comments:         Anesthesia Quick Evaluation

## 2018-02-25 NOTE — Progress Notes (Signed)
Patient admitted to unit. Oriented to room, call bell, and staff. Bed in lowest position. Fall safety plan reviewed. Full assessment to Epic. Skin assessment verified with Maricar RN. Telemetry box verification with tele clerk and Olivia Mackie NT- Box#: [id number]. Will continue to monitor.

## 2018-02-25 NOTE — Interval H&P Note (Signed)
History and Physical Interval Note:  02/25/2018 1:16 PM  Misty Trevino  has presented today for surgery, with the diagnosis of [place]  The various methods of treatment have been discussed with the patient and family. After consideration of risks, benefits and other options for treatment, the patient has consented to  Procedure(s): [place] (N/A) as a surgical intervention .  The patient's history has been reviewed, patient examined, no change in status, stable for surgery.  I have reviewed the patient's chart and labs.  Questions were answered to the patient's satisfaction.     Reynolds Kittel [employer]

## 2018-02-25 NOTE — Op Note (Signed)
[facility] Cardiology   02/25/2018                     3:01 PM  PATIENT:  Misty Trevino    PRE-OPERATIVE DIAGNOSIS:  SICK SINUS SYNDROME,BRADYCARDIA  POST-OPERATIVE DIAGNOSIS:  Same  PROCEDURE:  INSERTION PACEMAKER- INITIAL DUAL CHAMBER  SURGEON:  [name], MD    ANESTHESIA:     PREOPERATIVE INDICATIONS:  Misty Trevino is a  84 y.o. female with a diagnosis of [place] who failed conservative measures and elected for surgical management.    The risks benefits and alternatives were discussed with the patient preoperatively including but not limited to the risks of infection, bleeding, cardiopulmonary complications, the need for revision surgery, among others, and the patient was willing to proceed.   OPERATIVE PROCEDURE: The patient was brought to the operating room in a fasting state.  The left pectoral region was prepped and draped in usual sterile manner.  Anesthesia was obtained 1% lidocaine locally.  A 6 cm incision was performed the left pectoral region.  The pacemaker pocket was generated by electrocautery and blunt dissection.  Access was obtained to the left subclavian vein by fine-needle aspiration.  MRI compatible leads were positioned into the right ventricular apical septum ( Medtronic [id number] ) and right atrial appendage ( Medtronic [id number] ) under fluoroscopic guidance.  After proper thresholds were obtained the leads were sutured in place.  The leads were connected to an MRI compatible dual-chamber rate responsive pacemaker generator ( Medtronic [id number] ).  The pacemaker pocket was irrigated with gentamicin solution.  The pacemaker generator was positioned into the pocket and the pocket was closed with 2-0 and 4-0 Vicryl, respectively.  Steri-Strips and pressure dressing were applied.  No periprocedural complications.  Postprocedural pacemaker interrogation revealed appropriate dual-chamber atrial and ventricular sensing and pacing  thresholds.

## 2018-02-26 ENCOUNTER — Encounter: Payer: Self-pay | Admitting: Cardiology

## 2018-02-26 DIAGNOSIS — I495 Sick sinus syndrome: Secondary | ICD-10-CM | POA: Diagnosis not present

## 2018-02-26 LAB — GLUCOSE, CAPILLARY: GLUCOSE-CAPILLARY: 152 mg/dL — AB (ref 70–99)

## 2018-02-26 MED ORDER — CEPHALEXIN 250 MG PO CAPS: 250.0000 mg | ORAL_CAPSULE | Freq: Two times a day (BID) | ORAL | 0 refills | Status: DC

## 2018-02-26 MED ORDER — METOPROLOL SUCCINATE ER 25 MG PO TB24: 25.0000 mg | ORAL_TABLET | Freq: Every day | ORAL | 5 refills | Status: DC

## 2018-02-26 NOTE — Discharge Summary (Signed)
    Physician Discharge Summary  Patient ID: Misty Trevino MRN: [id number] DOB/AGE: 1921/06/30 84 y.o.  Admit date: 02/25/2018 Discharge date: 02/26/2018  Primary Discharge Diagnosis Sick sinus syndrome Secondary Discharge Diagnosis same  Significant Diagnostic Studies: no  Consults: None  Hospital Course: 84 year old female with a diagnosis of sick sinus syndrome who failed conservative measures and elected for surgical management, who successfully underwent dual-chamber pacemaker implantation on 02/25/2018 without perioperative complications. ECG revealed atrial sensed ventricular paced rhythm at a rate of 69 bpm. Chest xray negative for pneumothorax.    Discharge Exam: Blood pressure (!) 151/46, pulse 66, temperature 97.6 F (36.4 C), temperature source Oral, resp. rate 18, height 5\' 2"  (1.575 m), weight 50.1 kg, SpO2 94 %.   General appearance: alert, cooperative, appears stated age and no distress Head: Normocephalic, without obvious abnormality, atraumatic Eyes: negative Back: negative Resp: clear to auscultation bilaterally Cardio: regular rate and rhythm, S1, S2 normal, no murmur, click, rub or gallop Extremities: extremities normal, atraumatic, no cyanosis or edema Incision/Wound:Left upper chest-Tegaderm in place without drainage. No surrounding erythema, edema, or warmth.  Labs:   Lab Results  Component Value Date   WBC 13.4 (H) 02/19/2018   HGB 14.0 02/19/2018   HCT 40.9 02/19/2018   MCV 98.2 02/19/2018   PLT 309 02/19/2018    Recent Labs  Lab 02/19/18 1245  NA 139  K 4.6  CL 102  CO2 26  BUN 41*  CREATININE 1.14*  CALCIUM 9.6  GLUCOSE 191*      Radiology: negative for pneumothorax EKG: atrial sensed ventricular paced rhythm at a rate of 69 bpm  FOLLOW UP PLANS AND APPOINTMENTS  Allergies as of 02/26/2018   No Known Allergies     Medication List    TAKE these medications   allopurinol 100 MG tablet Commonly known as:  ZYLOPRIM Take 100 mg  by mouth every evening.   carboxymethylcellulose 0.5 % Soln Commonly known as:  REFRESH PLUS Place 1 drop into both eyes every evening.   cephALEXin 250 MG capsule Commonly known as:  KEFLEX Take 1 capsule (250 mg total) by mouth 2 (two) times daily.   cholecalciferol 1000 units tablet Commonly known as:  VITAMIN D Take 1,000 Units by mouth daily.   ELIQUIS 2.5 MG Tabs tablet Generic drug:  apixaban Take 2.5 mg by mouth 2 (two) times daily.   metFORMIN 850 MG tablet Commonly known as:  GLUCOPHAGE Take 850 mg by mouth daily with breakfast.   metoprolol succinate 25 MG 24 hr tablet Commonly known as:  TOPROL-XL Take 1 tablet (25 mg total) by mouth daily.   PRESERVISION AREDS 2 PO Take 1 capsule by mouth 2 (two) times daily.      Follow-up Information    [name], MD. Go on 03/05/2018.   Specialty:  Cardiology Why:  Appointment Time: @ 10:15 Contact information: [facility] [place] [address] [phone]           BRING ALL MEDICATIONS WITH YOU TO FOLLOW UP APPOINTMENTS  Time spent with patient to include physician time: 25 minutes Signed:  Clabe Seal PA-C 02/26/2018, 8:31 AM

## 2018-02-26 NOTE — Plan of Care (Signed)
  Problem: Education: Goal: Knowledge of General Education information will improve Description: Including pain rating scale, medication(s)/side effects and non-pharmacologic comfort measures Outcome: Adequate for Discharge   Problem: Health Behavior/Discharge Planning: Goal: Ability to manage health-related needs will improve Outcome: Adequate for Discharge   Problem: Clinical Measurements: Goal: Ability to maintain clinical measurements within normal limits will improve Outcome: Adequate for Discharge Goal: Will remain free from infection Outcome: Adequate for Discharge Goal: Diagnostic test results will improve Outcome: Adequate for Discharge Goal: Respiratory complications will improve Outcome: Adequate for Discharge Goal: Cardiovascular complication will be avoided Outcome: Adequate for Discharge   Problem: Activity: Goal: Risk for activity intolerance will decrease Outcome: Adequate for Discharge   Problem: Nutrition: Goal: Adequate nutrition will be maintained Outcome: Adequate for Discharge   Problem: Coping: Goal: Level of anxiety will decrease Outcome: Adequate for Discharge   Problem: Elimination: Goal: Will not experience complications related to bowel motility Outcome: Adequate for Discharge Goal: Will not experience complications related to urinary retention Outcome: Adequate for Discharge   Problem: Pain Managment: Goal: General experience of comfort will improve Outcome: Adequate for Discharge   Problem: Safety: Goal: Ability to remain free from injury will improve Outcome: Adequate for Discharge   Problem: Skin Integrity: Goal: Risk for impaired skin integrity will decrease Outcome: Adequate for Discharge   Problem: Education: Goal: Knowledge of cardiac device and self-care will improve Outcome: Adequate for Discharge Goal: Ability to safely manage health related needs after discharge will improve Outcome: Adequate for Discharge Goal:  Individualized Educational Video(s) Outcome: Adequate for Discharge   Problem: Cardiac: Goal: Ability to achieve and maintain adequate cardiopulmonary perfusion will improve Outcome: Adequate for Discharge   

## 2018-02-26 NOTE — Discharge Instructions (Signed)
For 6 weeks, avoid lifting greater than 15 pounds or raising your left arm above your head. You may shower in 24 hours, but avoid direct contact with the shower head to incision site. Leave exterior bandage alone; we will remove it at your follow-up appointment with Misty Trevino in 1 week. You have been given a [employer] Carelink box for remote checks of your pacemaker. Plug that in to an outlet in the room where you sleep, within 10 feet of where you sleep, and turn it on. You do not need to take this with you to your follow-up appointment. You will learn more about it at your follow-up appointment.

## 2018-02-26 NOTE — Progress Notes (Signed)
Patient given discharge instructions with daughter at bedside. Prescriptions given, IV's taken out and tele monitor off. Dr. Josefa Half paged to clarify Metoprolol order, per verbal order patient to take 50mg  daily of metoprolol. Patient and daughter agree. No more questions or concerns. Patient being wheeled down by volunteer to family vehicle.

## 2018-02-27 NOTE — Anesthesia Postprocedure Evaluation (Signed)
Anesthesia Post Note  Patient: Misty Trevino  Procedure(s) Performed: INSERTION PACEMAKER- INITIAL DUAL CHAMBER (N/A )  Patient location during evaluation: PACU Anesthesia Type: General Level of consciousness: awake and alert Pain management: pain level controlled Vital Signs Assessment: post-procedure vital signs reviewed and stable Respiratory status: spontaneous breathing, nonlabored ventilation, respiratory function stable and patient connected to nasal cannula oxygen Cardiovascular status: blood pressure returned to baseline and stable Postop Assessment: no apparent nausea or vomiting Anesthetic complications: no     Last Vitals:  Vitals:   02/26/18 0725 02/26/18 0800  BP: (!) 151/46   Pulse: 66   Resp:  18  Temp: 36.4 C   SpO2: 94%     Last Pain:  Vitals:   02/26/18 0800  TempSrc:   PainSc: 0-No pain                 Molli Barrows

## 2018-04-12 ENCOUNTER — Emergency Department: Payer: Medicare Other

## 2018-04-12 ENCOUNTER — Inpatient Hospital Stay
Admission: EM | Admit: 2018-04-12 | Discharge: 2018-04-20 | DRG: 291 | Disposition: A | Discharge status: Skilled Nursing Facility | Payer: Medicare Other | Attending: Internal Medicine | Admitting: Internal Medicine

## 2018-04-12 ENCOUNTER — Other Ambulatory Visit: Payer: Self-pay

## 2018-04-12 DIAGNOSIS — Z950 Presence of cardiac pacemaker: Secondary | ICD-10-CM

## 2018-04-12 DIAGNOSIS — Z8673 Personal history of transient ischemic attack (TIA), and cerebral infarction without residual deficits: Secondary | ICD-10-CM | POA: Diagnosis not present

## 2018-04-12 DIAGNOSIS — Z79899 Other long term (current) drug therapy: Secondary | ICD-10-CM | POA: Diagnosis not present

## 2018-04-12 DIAGNOSIS — Z66 Do not resuscitate: Secondary | ICD-10-CM | POA: Diagnosis present

## 2018-04-12 DIAGNOSIS — I509 Heart failure, unspecified: Secondary | ICD-10-CM

## 2018-04-12 DIAGNOSIS — E119 Type 2 diabetes mellitus without complications: Secondary | ICD-10-CM | POA: Diagnosis present

## 2018-04-12 DIAGNOSIS — Z515 Encounter for palliative care: Secondary | ICD-10-CM | POA: Diagnosis not present

## 2018-04-12 DIAGNOSIS — R0789 Other chest pain: Secondary | ICD-10-CM | POA: Diagnosis present

## 2018-04-12 DIAGNOSIS — J9601 Acute respiratory failure with hypoxia: Secondary | ICD-10-CM | POA: Diagnosis present

## 2018-04-12 DIAGNOSIS — M109 Gout, unspecified: Secondary | ICD-10-CM | POA: Diagnosis present

## 2018-04-12 DIAGNOSIS — R0602 Shortness of breath: Secondary | ICD-10-CM

## 2018-04-12 DIAGNOSIS — R079 Chest pain, unspecified: Secondary | ICD-10-CM | POA: Diagnosis present

## 2018-04-12 DIAGNOSIS — L309 Dermatitis, unspecified: Secondary | ICD-10-CM | POA: Diagnosis present

## 2018-04-12 DIAGNOSIS — E872 Acidosis: Secondary | ICD-10-CM | POA: Diagnosis present

## 2018-04-12 DIAGNOSIS — Z7984 Long term (current) use of oral hypoglycemic drugs: Secondary | ICD-10-CM

## 2018-04-12 DIAGNOSIS — Z7901 Long term (current) use of anticoagulants: Secondary | ICD-10-CM | POA: Diagnosis not present

## 2018-04-12 DIAGNOSIS — Z7189 Other specified counseling: Secondary | ICD-10-CM | POA: Diagnosis not present

## 2018-04-12 DIAGNOSIS — I5021 Acute systolic (congestive) heart failure: Secondary | ICD-10-CM | POA: Diagnosis present

## 2018-04-12 DIAGNOSIS — E43 Unspecified severe protein-calorie malnutrition: Secondary | ICD-10-CM | POA: Diagnosis present

## 2018-04-12 DIAGNOSIS — Z681 Body mass index (BMI) 19 or less, adult: Secondary | ICD-10-CM

## 2018-04-12 DIAGNOSIS — I480 Paroxysmal atrial fibrillation: Secondary | ICD-10-CM | POA: Diagnosis present

## 2018-04-12 DIAGNOSIS — I441 Atrioventricular block, second degree: Secondary | ICD-10-CM | POA: Diagnosis not present

## 2018-04-12 DIAGNOSIS — R002 Palpitations: Secondary | ICD-10-CM | POA: Diagnosis not present

## 2018-04-12 DIAGNOSIS — I495 Sick sinus syndrome: Secondary | ICD-10-CM | POA: Diagnosis present

## 2018-04-12 DIAGNOSIS — A419 Sepsis, unspecified organism: Secondary | ICD-10-CM | POA: Diagnosis present

## 2018-04-12 DIAGNOSIS — I110 Hypertensive heart disease with heart failure: Principal | ICD-10-CM | POA: Diagnosis present

## 2018-04-12 DIAGNOSIS — D72829 Elevated white blood cell count, unspecified: Secondary | ICD-10-CM | POA: Diagnosis present

## 2018-04-12 DIAGNOSIS — R238 Other skin changes: Secondary | ICD-10-CM | POA: Diagnosis not present

## 2018-04-12 DIAGNOSIS — M25561 Pain in right knee: Secondary | ICD-10-CM | POA: Diagnosis not present

## 2018-04-12 LAB — GLUCOSE, CAPILLARY
Glucose-Capillary: 166 mg/dL — ABNORMAL HIGH (ref 70–99)
Glucose-Capillary: 164 mg/dL — ABNORMAL HIGH (ref 70–99)
Glucose-Capillary: 151 mg/dL — ABNORMAL HIGH (ref 70–99)
Glucose-Capillary: 227 mg/dL — ABNORMAL HIGH (ref 70–99)

## 2018-04-12 LAB — BASIC METABOLIC PANEL
Anion gap: 14 (ref 5–15)
BUN: 25 mg/dL — ABNORMAL HIGH (ref 8–23)
CALCIUM: 9.5 mg/dL (ref 8.9–10.3)
CO2: 25 mmol/L (ref 22–32)
CREATININE: 0.84 mg/dL (ref 0.44–1.00)
Chloride: 96 mmol/L — ABNORMAL LOW (ref 98–111)
GFR calc non Af Amer: 57 mL/min — ABNORMAL LOW (ref >60)
Glucose, Bld: 169 mg/dL — ABNORMAL HIGH (ref 70–99)
Potassium: 4.9 mmol/L (ref 3.5–5.1)
Sodium: 135 mmol/L (ref 135–145)

## 2018-04-12 LAB — URINALYSIS, ROUTINE W REFLEX MICROSCOPIC
Bilirubin Urine: NEGATIVE
Glucose, UA: NEGATIVE mg/dL
Hgb urine dipstick: NEGATIVE
Ketones, ur: NEGATIVE mg/dL
Leukocytes, UA: NEGATIVE
Nitrite: NEGATIVE
Protein, ur: NEGATIVE mg/dL
Specific Gravity, Urine: >1.046 — ABNORMAL HIGH (ref 1.005–1.030)
pH: 5 (ref 5.0–8.0)

## 2018-04-12 LAB — HEPATIC FUNCTION PANEL
ALBUMIN: 4.2 g/dL (ref 3.5–5.0)
ALT: 10 U/L (ref 0–44)
AST: 19 U/L (ref 15–41)
Alkaline Phosphatase: 78 U/L (ref 38–126)
BILIRUBIN TOTAL: 0.6 mg/dL (ref 0.3–1.2)
Bilirubin, Direct: <0.1 mg/dL (ref 0.0–0.2)
Total Protein: 6.9 g/dL (ref 6.5–8.1)

## 2018-04-12 LAB — LACTIC ACID, PLASMA
LACTIC ACID, VENOUS: 1.6 mmol/L (ref 0.5–1.9)
LACTIC ACID, VENOUS: 2.3 mmol/L — AB (ref 0.5–1.9)
Lactic Acid, Venous: 2.3 mmol/L (ref 0.5–1.9)
Lactic Acid, Venous: 3.5 mmol/L (ref 0.5–1.9)
Lactic Acid, Venous: 1.9 mmol/L (ref 0.5–1.9)
Lactic Acid, Venous: 2.8 mmol/L (ref 0.5–1.9)

## 2018-04-12 LAB — CBC
HCT: 38.4 % (ref 35.0–47.0)
Hemoglobin: 12.5 g/dL (ref 12.0–16.0)
MCH: 31.5 pg (ref 26.0–34.0)
MCHC: 32.5 g/dL (ref 32.0–36.0)
MCV: 97 fL (ref 80.0–100.0)
PLATELETS: 345 10*3/uL (ref 150–440)
RBC: 3.96 MIL/uL (ref 3.80–5.20)
RDW: 13.5 % (ref 11.5–14.5)
WBC: 19.4 10*3/uL — ABNORMAL HIGH (ref 3.6–11.0)

## 2018-04-12 LAB — TROPONIN I
Troponin I: <0.03 ng/mL (ref <0.03)
Troponin I: <0.03 ng/mL (ref <0.03)
Troponin I: <0.03 ng/mL (ref <0.03)

## 2018-04-12 LAB — PROCALCITONIN: Procalcitonin: <0.1 ng/mL

## 2018-04-12 LAB — LIPASE, BLOOD: Lipase: 34 U/L (ref 11–51)

## 2018-04-12 LAB — HEMOGLOBIN A1C
Hgb A1c MFr Bld: 6.9 % — ABNORMAL HIGH (ref 4.8–5.6)
Mean Plasma Glucose: 151.33 mg/dL

## 2018-04-12 LAB — TSH: TSH: 1.248 u[IU]/mL (ref 0.350–4.500)

## 2018-04-12 MED ORDER — APIXABAN 2.5 MG PO TABS
2.5000 mg | ORAL_TABLET | Freq: Two times a day (BID) | ORAL | Status: DC
  Administered 2018-04-12 – 2018-04-20 (×17): 2.5 mg via ORAL
  Filled 2018-04-12 (×17): qty 1

## 2018-04-12 MED ORDER — ACETAMINOPHEN 650 MG RE SUPP: 650.0000 mg | Freq: Four times a day (QID) | RECTAL | Status: DC | PRN

## 2018-04-12 MED ORDER — NITROGLYCERIN 2 % TD OINT
0.5000 [in_us] | TOPICAL_OINTMENT | Freq: Once | TRANSDERMAL | Status: AC
  Administered 2018-04-12: 0.5 [in_us] via TOPICAL
  Filled 2018-04-12: qty 1

## 2018-04-12 MED ORDER — NITROGLYCERIN 0.4 MG SL SUBL
0.4000 mg | SUBLINGUAL_TABLET | Freq: Once | SUBLINGUAL | Status: AC
  Administered 2018-04-12: 0.4 mg via SUBLINGUAL
  Filled 2018-04-12: qty 1

## 2018-04-12 MED ORDER — VANCOMYCIN HCL IN DEXTROSE 750-5 MG/150ML-% IV SOLN
750.0000 mg | INTRAVENOUS | Status: DC
  Administered 2018-04-13: 750 mg via INTRAVENOUS
  Filled 2018-04-12 (×2): qty 150

## 2018-04-12 MED ORDER — SODIUM CHLORIDE 0.9 % IV SOLN
1.0000 g | INTRAVENOUS | Status: DC
  Administered 2018-04-12: 1 g via INTRAVENOUS
  Filled 2018-04-12: qty 1

## 2018-04-12 MED ORDER — VANCOMYCIN HCL IN DEXTROSE 750-5 MG/150ML-% IV SOLN
750.0000 mg | Freq: Once | INTRAVENOUS | Status: AC
  Administered 2018-04-12: 750 mg via INTRAVENOUS
  Filled 2018-04-12: qty 150

## 2018-04-12 MED ORDER — ACETAMINOPHEN 325 MG PO TABS
650.0000 mg | ORAL_TABLET | Freq: Four times a day (QID) | ORAL | Status: DC | PRN
  Administered 2018-04-12 – 2018-04-19 (×12): 650 mg via ORAL
  Filled 2018-04-12 (×12): qty 2

## 2018-04-12 MED ORDER — ASPIRIN 81 MG PO CHEW
324.0000 mg | CHEWABLE_TABLET | Freq: Once | ORAL | Status: AC
  Administered 2018-04-12: 324 mg via ORAL
  Filled 2018-04-12: qty 4

## 2018-04-12 MED ORDER — ONDANSETRON HCL 4 MG/2ML IJ SOLN: 4.0000 mg | Freq: Four times a day (QID) | INTRAMUSCULAR | Status: DC | PRN

## 2018-04-12 MED ORDER — PIPERACILLIN-TAZOBACTAM 3.375 G IVPB
3.3750 g | Freq: Three times a day (TID) | INTRAVENOUS | Status: DC
  Administered 2018-04-12 – 2018-04-15 (×9): 3.375 g via INTRAVENOUS
  Filled 2018-04-12 (×9): qty 50

## 2018-04-12 MED ORDER — INSULIN ASPART 100 UNIT/ML ~~LOC~~ SOLN
0.0000 [IU] | Freq: Three times a day (TID) | SUBCUTANEOUS | Status: DC
  Administered 2018-04-12 – 2018-04-13 (×5): 2 [IU] via SUBCUTANEOUS
  Administered 2018-04-14: 1 [IU] via SUBCUTANEOUS
  Administered 2018-04-14 – 2018-04-15 (×2): 2 [IU] via SUBCUTANEOUS
  Administered 2018-04-15: 3 [IU] via SUBCUTANEOUS
  Administered 2018-04-15: 1 [IU] via SUBCUTANEOUS
  Administered 2018-04-16: 3 [IU] via SUBCUTANEOUS
  Administered 2018-04-16: 7 [IU] via SUBCUTANEOUS
  Administered 2018-04-16: 2 [IU] via SUBCUTANEOUS
  Administered 2018-04-17: 3 [IU] via SUBCUTANEOUS
  Administered 2018-04-17 (×2): 1 [IU] via SUBCUTANEOUS
  Administered 2018-04-18: 2 [IU] via SUBCUTANEOUS
  Administered 2018-04-18: 5 [IU] via SUBCUTANEOUS
  Administered 2018-04-19: 3 [IU] via SUBCUTANEOUS
  Administered 2018-04-19: 2 [IU] via SUBCUTANEOUS
  Administered 2018-04-19: 3 [IU] via SUBCUTANEOUS
  Administered 2018-04-20: 5 [IU] via SUBCUTANEOUS
  Administered 2018-04-20: 2 [IU] via SUBCUTANEOUS
  Filled 2018-04-12 (×23): qty 1

## 2018-04-12 MED ORDER — POLYVINYL ALCOHOL 1.4 % OP SOLN
1.0000 [drp] | Freq: Every evening | OPHTHALMIC | Status: DC
  Administered 2018-04-12 – 2018-04-19 (×8): 1 [drp] via OPHTHALMIC
  Filled 2018-04-12: qty 15

## 2018-04-12 MED ORDER — DOCUSATE SODIUM 100 MG PO CAPS
100.0000 mg | ORAL_CAPSULE | Freq: Two times a day (BID) | ORAL | Status: DC
  Administered 2018-04-12 – 2018-04-20 (×16): 100 mg via ORAL
  Filled 2018-04-12 (×17): qty 1

## 2018-04-12 MED ORDER — IOHEXOL 350 MG/ML SOLN
75.0000 mL | Freq: Once | INTRAVENOUS | Status: AC | PRN
  Administered 2018-04-12: 75 mL via INTRAVENOUS

## 2018-04-12 MED ORDER — SODIUM CHLORIDE 0.9 % IV BOLUS
1000.0000 mL | Freq: Once | INTRAVENOUS | Status: AC
  Administered 2018-04-12: 1000 mL via INTRAVENOUS

## 2018-04-12 MED ORDER — CARBOXYMETHYLCELLULOSE SODIUM 0.5 % OP SOLN: 1.0000 [drp] | Freq: Every evening | OPHTHALMIC | Status: DC

## 2018-04-12 MED ORDER — VITAMIN D3 25 MCG (1000 UNIT) PO TABS
1000.0000 [IU] | ORAL_TABLET | Freq: Every day | ORAL | Status: DC
  Administered 2018-04-12 – 2018-04-20 (×9): 1000 [IU] via ORAL
  Filled 2018-04-12 (×9): qty 1

## 2018-04-12 MED ORDER — ALLOPURINOL 100 MG PO TABS
100.0000 mg | ORAL_TABLET | Freq: Every evening | ORAL | Status: DC
  Administered 2018-04-12 – 2018-04-19 (×8): 100 mg via ORAL
  Filled 2018-04-12 (×10): qty 1

## 2018-04-12 MED ORDER — INSULIN ASPART 100 UNIT/ML ~~LOC~~ SOLN
0.0000 [IU] | Freq: Every day | SUBCUTANEOUS | Status: DC
  Administered 2018-04-12 – 2018-04-19 (×2): 2 [IU] via SUBCUTANEOUS
  Filled 2018-04-12 (×2): qty 1

## 2018-04-12 MED ORDER — ONDANSETRON HCL 4 MG PO TABS: 4.0000 mg | ORAL_TABLET | Freq: Four times a day (QID) | ORAL | Status: DC | PRN

## 2018-04-12 MED ORDER — ORAL CARE MOUTH RINSE
15.0000 mL | Freq: Two times a day (BID) | OROMUCOSAL | Status: DC
  Administered 2018-04-13 – 2018-04-18 (×8): 15 mL via OROMUCOSAL

## 2018-04-12 MED ORDER — SODIUM CHLORIDE 0.9 % IV SOLN
INTRAVENOUS | Status: DC
  Administered 2018-04-12 (×2): via INTRAVENOUS

## 2018-04-12 MED ORDER — SODIUM CHLORIDE 0.9 % IV BOLUS
500.0000 mL | Freq: Once | INTRAVENOUS | Status: AC
  Administered 2018-04-12: 500 mL via INTRAVENOUS

## 2018-04-12 MED ORDER — TRIAMCINOLONE ACETONIDE 0.1 % EX CREA
1.0000 "application " | TOPICAL_CREAM | Freq: Every day | CUTANEOUS | Status: DC
  Administered 2018-04-12 – 2018-04-19 (×7): 1 via TOPICAL
  Filled 2018-04-12 (×2): qty 15

## 2018-04-12 MED ORDER — SODIUM CHLORIDE 0.9 % IV SOLN
500.0000 mg | INTRAVENOUS | Status: DC
  Administered 2018-04-12: 500 mg via INTRAVENOUS
  Filled 2018-04-12: qty 500

## 2018-04-12 NOTE — ED Triage Notes (Signed)
patient c/o generalized chest pain with radiation to left arm and neck. Patient reports accompanying symptoms of weakness and SOB. Patient had a pacemaker placed approx 6 weeks ago.

## 2018-04-12 NOTE — Progress Notes (Signed)
The patient has no complaints, she want to go home. Vital signs reviewed.  Labs reviewed. Physical examinations is unremarkable. 84 year old female admitted for chest pain.  1.  Chest pain:  Normal cardiac biomarkers.  Consult cardiology.  Nitropaste for ongoing chest pain, on Eliquis.  2.  Sepsis: Source of infection is unclear at this time.  Follow CBC and blood cultures for growth and sensitivity.  Pharmacy to dose vancomycin and Zosyn.  Zithromax and Rocephin were discontinued.  Lactic acidosis due to above.  Follow-up level.  3.  Arrhythmia: Continue Eliquis 4.  Diabetes mellitus type 2: Hold metformin for now.  Sliding scale insulin while hospitalized 5.  Gout: Continue allopurinol.  I discussed with the patient, her son, daughter-in-law, Therapist, sports. Time spent about 32 minutes.

## 2018-04-12 NOTE — Progress Notes (Signed)
   04/12/18 0800  Clinical Encounter Type  Visited With Patient  Visit Type Initial;Spiritual support (AD education)  Recommendations Follow-up, as requested.  Spiritual Encounters  Spiritual Needs Emotional;Prayer   Chaplain met with patient and facilitated communication with her pastor, Rev. Lucretia Roers, [place]. Patient has AD and will ask son to bring it to the hospital.  Bonney Roussel will return as needed.

## 2018-04-12 NOTE — ED Provider Notes (Signed)
[facility] Emergency Department Provider Note  ___________________________________________   First MD Initiated Contact with Patient 04/12/18 0040     (approximate)  I have reviewed the triage vital signs and the nursing notes.   HISTORY  Chief Complaint Chest Pain   HPI Misty Trevino is a 84 y.o. female with a history of diabetes, heart block status post pacer placement several weeks ago who was presented to the emergency department with lower central chest pain radiating to her left arm as well as her neck.  States that the pain is an 8 out of 10 at this time and started at midnight but also had an episode lasting several minutes that started at noon, yesterday.  Denies any nausea, vomiting.  Does say that she has had shortness of breath but denies the pain worsening with deep breathing.  Says that she is on Eliquis but not aspirin.  Says that she has no history of heart attack or stents.   Past Medical History:  Diagnosis Date  . Diabetes mellitus without complication ([place])   . Dyspnea    chronic doe  . Dysrhythmia    afib / block  . Edema    pedal  . GERD (gastroesophageal reflux disease)   . History of orthopnea   . Hypertension   . Sick sinus syndrome ([place])   . Stroke [facility])    tia 2 years ago    Patient Active Problem List   Diagnosis Date Noted  . [place]itz type 2 second degree heart block 02/25/2018    Past Surgical History:  Procedure Laterality Date  . BACK SURGERY     disc repair? unsure  . EYE SURGERY Left   . PACEMAKER INSERTION N/A 02/25/2018   Procedure: INSERTION PACEMAKER- INITIAL DUAL CHAMBER;  Surgeon: [name], MD;  Location: [facility] ORS;  Service: Cardiovascular;  Laterality: N/A;  . SHOULDER ARTHROSCOPY Right    x2    Prior to Admission medications   Medication Sig Start Date End Date Taking? Authorizing Provider  allopurinol (ZYLOPRIM) 100 MG tablet Take 100 mg by mouth every evening.   Yes [provider]  metoprolol succinate (TOPROL XL) 25 MG 24 hr tablet Take 1 tablet (25 mg total) by mouth daily. 02/26/18  Yes Clabe Seal, PA-C  apixaban (ELIQUIS) 2.5 MG TABS tablet Take 2.5 mg by mouth 2 (two) times daily.    [provider]  carboxymethylcellulose (REFRESH PLUS) 0.5 % SOLN Place 1 drop into both eyes every evening.    [provider]  cephALEXin (KEFLEX) 250 MG capsule Take 1 capsule (250 mg total) by mouth 2 (two) times daily. 02/26/18   Clabe Seal, PA-C  cholecalciferol (VITAMIN D) 1000 units tablet Take 1,000 Units by mouth daily.    [provider]  metFORMIN (GLUCOPHAGE) 850 MG tablet Take 850 mg by mouth daily with breakfast.    [provider]  Multiple Vitamins-Minerals (PRESERVISION AREDS 2 PO) Take 1 capsule by mouth 2 (two) times daily.    [provider]    Allergies Patient has no known allergies.  No family history on file.  Social History Social History   Tobacco Use  . Smoking status: Never Smoker  . Smokeless tobacco: Never Used  Substance Use Topics  . Alcohol use: Never    Frequency: Never  . Drug use: Never    Review of Systems  Constitutional: No fever/chills Eyes: No visual changes. ENT: No sore throat. Cardiovascular: As above Respiratory: As  above Gastrointestinal: No abdominal pain.  No nausea, no vomiting.  No diarrhea.  No constipation. Genitourinary: Negative for dysuria. Musculoskeletal: Negative for back pain. Skin: Negative for rash. Neurological: Negative for headaches, focal weakness or numbness.   ____________________________________________   PHYSICAL EXAM:  VITAL SIGNS: ED Triage Vitals  Enc Vitals Group     BP 04/12/18 0042 137/74     Pulse Rate 04/12/18 0042 (!) 101     Resp 04/12/18 0042 (!) 85     Temp --      Temp src --      SpO2 04/12/18 0042 94 %     Weight 04/12/18 0033 110 lb 3.7 oz (50 kg)     Height --      Head Circumference --      Peak Flow --        Pain Score 04/12/18 0033 8     Pain Loc --      Pain Edu? --      Excl. in [place]? --     Constitutional: Alert and oriented.  no acute distress. Eyes: Conjunctivae are normal.  Head: Atraumatic. Nose: No congestion/rhinnorhea. Mouth/Throat: Mucous membranes are moist.  Neck: No stridor.   Cardiovascular: Normal rate, regular rhythm. Grossly normal heart sounds.  Respiratory: Normal respiratory effort.  No retractions. Lungs CTAB. Gastrointestinal: Soft and nontender. No distention. No CVA tenderness. Musculoskeletal: No lower extremity tenderness nor edema.  No joint effusions. Neurologic:  Normal speech and language. No gross focal neurologic deficits are appreciated. Skin:  Skin is warm, dry and intact. No rash noted. Psychiatric: Mood and affect are normal. Speech and behavior are normal.  ____________________________________________   LABS (all labs ordered are listed, but only abnormal results are displayed)  Labs Reviewed  BASIC METABOLIC PANEL  CBC  TROPONIN I   ____________________________________________  EKG  ED ECG REPORT I, Doran Stabler, the attending physician, personally viewed and interpreted this ECG.   Date: 04/12/2018  EKG Time: 0035  Rate: 87  Rhythm: Atrial sensed ventricular pacing.  Axis: Normal for paced rhythm  Intervals:Wide-complex consistent with ventricular pacing.  ST&T Change: 2 mm ST elevations in 2 3 and aVF which are slightly increased from previous.  T wave inversions in aVL also unchanged from previous.  No reciprocal depressions.  No concordant ST elevation or greater than 4 mm discordant elevation.  ED ECG REPORT I, Doran Stabler, the attending physician, personally viewed and interpreted this ECG.   Date: 04/12/2018  EKG Time: 0043  Rate: 102  Rhythm: Ventricularly paced rhythm  Axis: Normal for age ventricularly paced rhythm.  Intervals: Wide-complex consistent with ventricular pacing  ST&T Change: Minimal ST  elevation in 2 3 and aVF.  Persistent T wave inversion in aVL.  No reciprocal depressions.  Poor, wandering baseline.  ____________________________________________  RADIOLOGY  Chest x-ray without acute process.  CT angiography without pulmonary emboli.  Suggested thickening of the distal esophagus which may represent sequela of reflux or esophagitis. ____________________________________________   PROCEDURES  Procedure(s) performed:   Procedures  Critical Care performed:   ____________________________________________   INITIAL IMPRESSION / ASSESSMENT AND PLAN / ED COURSE  Pertinent labs & imaging results that were available during my care of the patient were reviewed by me and considered in my medical decision making (see chart for details).  Differential diagnosis includes, but is not limited to, ACS, aortic dissection, pulmonary embolism, cardiac tamponade, pneumothorax, pneumonia, pericarditis, myocarditis, GI-related causes including esophagitis/gastritis, and musculoskeletal chest wall pain.  As part of my medical decision making, I reviewed the following data within the electronic MEDICAL RECORD NUMBER Notes from prior ED visits  ----------------------------------------- 1:00 AM on 04/12/2018 -----------------------------------------  Discussed the EKGs with Dr. call would of [facility] cardiology.  Recommends controlling the patient's pain.  Due to patient's age, paced rhythm and already anticoagulated with Eliquis he does not think it appropriate to emergently take the patient to the catheterization lab.  Also does not recommend further anticoagulation.  ----------------------------------------- 3:07 AM on 04/12/2018 -----------------------------------------  Patient's pain improved after nitro to 4 out of 10.  Patient not in any distress.  Reassuring CT angiography.  Reviewed results with patient as well as family.  Appears to have somewhat chronic appearing elevated white  blood cell count.  No obvious source at this time.  Abdomen is nontender and hepatic panel as well as lipase reassuring as well.  Patient to be admitted to the hospital.  Signed out to Dr. Marcille Blanco. ____________________________________________   FINAL CLINICAL IMPRESSION(S) / ED DIAGNOSES  Chest pain.  Shortness of breath.  NEW MEDICATIONS STARTED DURING THIS VISIT:  New Prescriptions   No medications on file     Note:  This document was prepared using Dragon voice recognition software and may include unintentional dictation errors.     Orbie Pyo, MD 04/12/18 [phone]

## 2018-04-12 NOTE — ED Notes (Signed)
Pt placed on oxygen at 2lpm via [place] for pox of 89% on ra.

## 2018-04-12 NOTE — Progress Notes (Signed)
CRITICAL VALUE ALERT  Critical Value:  Lactic Acid 2.8  Date & Time Notied:  04/12/18 1320  Provider Notified: Dr. Bridgett Larsson  Orders Received/Actions taken: N/A - text page sent to MD: Critical result called from lab. Lactic Acid 2.8

## 2018-04-12 NOTE — H&P (Signed)
Misty Trevino is an 84 y.o. female.   Chief Complaint: Chest pain HPI: Patient with past medical history of sick sinus syndrome status post pacemaker placement 6 weeks ago, diabetes and hypertension presents to the emergency department complaining of chest pain.  The patient's pain is central but radiates across both sides of her chest.  She denies nausea, vomiting or diaphoresis but she admits to some shortness of breath.  The pain is intermittent and began at rest.  She also admits that the pain radiated into her abdomen at some point.  Due to her recent procedure the emergency department staff contacted the cardiology service who recommended rule out of ischemic changes which prompted the emergency department staff to call the hospitalist service for admission.  Past Medical History:  Diagnosis Date  . Diabetes mellitus without complication ([place])   . Dyspnea    chronic doe  . Dysrhythmia    afib / block  . Edema    pedal  . GERD (gastroesophageal reflux disease)   . History of orthopnea   . Hypertension   . Sick sinus syndrome ([place])   . Stroke [facility])    tia 2 years ago    Past Surgical History:  Procedure Laterality Date  . BACK SURGERY     disc repair? unsure  . EYE SURGERY Left   . PACEMAKER INSERTION N/A 02/25/2018   Procedure: INSERTION PACEMAKER- INITIAL DUAL CHAMBER;  Surgeon: [name], MD;  Location: [facility] ORS;  Service: Cardiovascular;  Laterality: N/A;  . SHOULDER ARTHROSCOPY Right    x2    No family history on file. Social History:  reports that she has never smoked. She has never used smokeless tobacco. She reports that she does not drink alcohol or use drugs.  Allergies: No Known Allergies  Medications Prior to Admission  Medication Sig Dispense Refill  . allopurinol (ZYLOPRIM) 100 MG tablet Take 100 mg by mouth every evening.    Marland Kitchen apixaban (ELIQUIS) 2.5 MG TABS tablet Take 2.5 mg by mouth 2 (two) times daily.    . carboxymethylcellulose (REFRESH  PLUS) 0.5 % SOLN Place 1 drop into both eyes every evening.    . cholecalciferol (VITAMIN D) 1000 units tablet Take 1,000 Units by mouth daily.    . metFORMIN (GLUCOPHAGE) 850 MG tablet Take 850 mg by mouth daily with breakfast.    . Multiple Vitamins-Minerals (PRESERVISION AREDS 2 PO) Take 1 capsule by mouth 2 (two) times daily.    Marland Kitchen triamcinolone cream (KENALOG) 0.1 % Apply 1 application topically at bedtime.      Results for orders placed or performed during the hospital encounter of 04/12/18 (from the past 48 hour(s))  Basic metabolic panel     Status: Abnormal   Collection Time: 04/12/18 12:48 AM  Result Value Ref Range   Sodium 135 135 - 145 mmol/L   Potassium 4.9 3.5 - 5.1 mmol/L   Chloride 96 (L) 98 - 111 mmol/L   CO2 25 22 - 32 mmol/L   Glucose, Bld 169 (H) 70 - 99 mg/dL   BUN 25 (H) 8 - 23 mg/dL   Creatinine, Ser 0.84 0.44 - 1.00 mg/dL   Calcium 9.5 8.9 - 10.3 mg/dL   GFR calc non Af Amer 57 (L) >60 mL/min   GFR calc Af Amer >60 >60 mL/min    Comment: (NOTE) The eGFR has been calculated using the CKD EPI equation. This calculation has not been validated in all clinical situations. eGFR's persistently <60 mL/min signify possible  Chronic Kidney Disease.    Anion gap 14 5 - 15    Comment: Performed at [facility], [address] [place], [place] [address]  CBC     Status: Abnormal   Collection Time: 04/12/18 12:48 AM  Result Value Ref Range   WBC 19.4 (H) 3.6 - 11.0 K/uL   RBC 3.96 3.80 - 5.20 MIL/uL   Hemoglobin 12.5 12.0 - 16.0 g/dL   HCT 38.4 35.0 - 47.0 %   MCV 97.0 80.0 - 100.0 fL   MCH 31.5 26.0 - 34.0 pg   MCHC 32.5 32.0 - 36.0 g/dL   RDW 13.5 11.5 - 14.5 %   Platelets 345 150 - 440 K/uL    Comment: Performed at [facility], [address], [place], [place] [address]  Troponin I     Status: None   Collection Time: 04/12/18 12:48 AM  Result Value Ref Range   Troponin I <0.03 <0.03 ng/mL    Comment: Performed at [facility],  [address] [place], [place] [address]  Hepatic function panel     Status: None   Collection Time: 04/12/18 12:48 AM  Result Value Ref Range   Total Protein 6.9 6.5 - 8.1 g/dL   Albumin 4.2 3.5 - 5.0 g/dL   AST 19 15 - 41 U/L   ALT 10 0 - 44 U/L   Alkaline Phosphatase 78 38 - 126 U/L   Total Bilirubin 0.6 0.3 - 1.2 mg/dL   Bilirubin, Direct <0.1 0.0 - 0.2 mg/dL   Indirect Bilirubin NOT CALCULATED 0.3 - 0.9 mg/dL    Comment: Performed at [facility], [address] [place], [place] [address]  Lipase, blood     Status: None   Collection Time: 04/12/18 12:48 AM  Result Value Ref Range   Lipase 34 11 - 51 U/L    Comment: Performed at [facility], [place], [place] [address]   Ct Angio Chest Pe W And/or Wo Contrast  Result Date: 04/12/2018 CLINICAL DATA:  Chest pain with radiation to left arm and neck. Weakness and shortness of breath. EXAM: CT ANGIOGRAPHY CHEST WITH CONTRAST TECHNIQUE: Multidetector CT imaging of the chest was performed using the standard protocol during bolus administration of intravenous contrast. Multiplanar CT image reconstructions and MIPs were obtained to evaluate the vascular anatomy. CONTRAST:  50m OMNIPAQUE IOHEXOL 350 MG/ML SOLN COMPARISON:  Chest x-ray April 12, 2018. CT of the chest Nov 05, 2012. FINDINGS: Cardiovascular: The heart size is borderline. The thoracic aorta demonstrates atherosclerotic change but no aneurysm or dissection. Coronary artery calcifications are noted. No pulmonary emboli. Mediastinum/Nodes: No effusion. Suggested mild thickening of the distal esophagus. No adenopathy. The pacemaker generator is seen in the left anterior chest. Lungs/Pleura: Central airways are normal. No pneumothorax. Mild dependent atelectasis. No suspicious infiltrate, nodule, or mass. Upper Abdomen: Thickening of the left adrenal gland is likely due to hyperplasia. No other abnormalities in the upper abdomen. Musculoskeletal: No  chest wall abnormality. No acute or significant osseous findings. Review of the MIP images confirms the above findings. IMPRESSION: 1. No pulmonary emboli. 2. Suggested thickening of the distal esophagus may represent sequela of reflux or esophagitis. 3. Atherosclerotic change in the thoracic aorta. Coronary artery calcifications. 4. Probable left adrenal hyperplasia. Aortic Atherosclerosis (ICD10-I70.0). Electronically Signed   By: DDorise BullionIII M.D   On: 04/12/2018 02:53   Dg Chest Portable 1 View  Result Date: 04/12/2018 CLINICAL DATA:  Chest pain. EXAM:  PORTABLE CHEST 1 VIEW COMPARISON:  Radiograph 02/25/2018 FINDINGS: Left-sided pacemaker in place. Unchanged heart size and mediastinal contours. Aortic atherosclerosis. Mild interstitial coarsening that is chronic. No pulmonary edema, focal airspace disease, large pleural effusion or pneumothorax. Bones are under mineralized. IMPRESSION: No acute findings. Electronically Signed   By: Keith Rake M.D.   On: 04/12/2018 01:37    Review of Systems  Constitutional: Negative for chills and fever.  HENT: Negative for sore throat and tinnitus.   Eyes: Negative for blurred vision and redness.  Respiratory: Negative for cough and shortness of breath.   Cardiovascular: Positive for chest pain. Negative for palpitations, orthopnea and PND.  Gastrointestinal: Negative for abdominal pain, diarrhea, nausea and vomiting.  Genitourinary: Negative for dysuria, frequency and urgency.  Musculoskeletal: Negative for joint pain and myalgias.  Skin: Negative for rash.       No lesions  Neurological: Negative for speech change, focal weakness and weakness.  Endo/Heme/Allergies: Does not bruise/bleed easily.       No temperature intolerance  Psychiatric/Behavioral: Negative for depression and suicidal ideas.    Blood pressure (!) 108/49, pulse 79, temperature (!) 97 F (36.1 C), temperature source Oral, resp. rate 17, weight 50 kg, SpO2 100  %. Physical Exam  Vitals reviewed. Constitutional: She is oriented to person, place, and time. She appears well-developed and well-nourished. No distress.  HENT:  Head: Normocephalic and atraumatic.  Mouth/Throat: Oropharynx is clear and moist.  Eyes: Pupils are equal, round, and reactive to light. Conjunctivae and EOM are normal. No scleral icterus.  Neck: Normal range of motion. Neck supple. No JVD present. No tracheal deviation present. No thyromegaly present.  Cardiovascular: Normal rate, regular rhythm and normal heart sounds. Exam reveals no gallop and no friction rub.  No murmur heard. Respiratory: Effort normal and breath sounds normal.  GI: Soft. Bowel sounds are normal. She exhibits no distension. There is no tenderness.  Genitourinary:  Genitourinary Comments: Deferred  Musculoskeletal: Normal range of motion. She exhibits no edema.  Lymphadenopathy:    She has no cervical adenopathy.  Neurological: She is alert and oriented to person, place, and time. No cranial nerve deficit. She exhibits normal muscle tone.  Skin: Skin is warm and dry. No rash noted. No erythema.  Psychiatric: She has a normal mood and affect. Her behavior is normal. Judgment and thought content normal.     Assessment/Plan Is a 84 year old female admitted for chest pain. 1.  Chest pain: Typical and duration.  Continue to follow cardiac biomarkers.  Monitor telemetry.  Consult cardiology.  Nitropaste for ongoing chest pain 2.  Sepsis: Patient meets criteria via leukocytosis, lactic acidosis, tachypnea as well as intermittent tachycardia.  She is hemodynamically stable.  Source of infection is unclear at this time.  Urine appears clean.  The patient has a clear chest x-ray but increased lung volumes and interstitial markings consistent with somebody exposed to secondhand smoke.  She has no distinct opacities but we will treat for creamy acquired pneumonia at this time.  Follow blood cultures for growth and  sensitivity. 3.  Arrhythmia: Continue Eliquis 4.  Diabetes mellitus type 2: Hold metformin for now.  Sliding scale insulin while hospitalized 5.  Gout: Continue allopurinol 6.  DVT prophylaxis: Therapeutic anticoagulation 7.  GI prophylaxis: None The patient is a full code.  Time spent on admission orders and patient care approximately 45 minutes  Harrie Foreman, MD 04/12/2018, 4:00 AM

## 2018-04-12 NOTE — Progress Notes (Signed)
Pharmacy Antibiotic Note  Misty Trevino is a 84 y.o. female admitted on 04/12/2018 with sepsis.  Pharmacy has been consulted for Vancomycin and Zosyn dosing.  Plan: Vancomycin 750mg  IV every 24 hours.  Goal trough 15-20 mcg/mL. Zosyn 3.375g IV q8h (4 hour infusion). Will check a Vancomycin trough level prior to 5th dose.  Height: 5\' 4"  (162.6 cm) Weight: 114 lb 12.8 oz (52.1 kg) IBW/kg (Calculated) : 54.7  Temp (24hrs), Avg:97.7 F (36.5 C), Min:97 F (36.1 C), Max:98.3 F (36.8 C)  Recent Labs  Lab 04/12/18 0048 04/12/18 0335 04/12/18 0535 04/12/18 0914  WBC 19.4*  --   --   --   CREATININE 0.84  --   --   --   LATICACIDVEN  --  2.3* 3.5* 1.9    Estimated Creatinine Clearance: 33 mL/min (by C-G formula based on SCr of 0.84 mg/dL).    No Known Allergies  Antimicrobials this admission: Ceftriaxone 10/6 >> 10/6 Azithromycin 10/6 >> 10/6 Vancomycin 10/6 >> Zosyn 10/6 >>  Thank you for allowing pharmacy to be a part of this patient's care.  Paulina Fusi, PharmD, BCPS 04/12/2018 11:55 AM

## 2018-04-12 NOTE — Consult Note (Signed)
Reason for Consult: Chest pain Referring Physician: Dr. Benita Stabile primary Dr. Bridgett Larsson hospitalist Dr. Saralyn Pilar cardiologist  Misty Trevino is an 84 y.o. female.  84 year old white female history of recent permanent pacemaker placement 6 weeks ago by Dr. Christa See shows patient is done reasonably well after her treatment for sick sinus syndrome patient now presented with chest pain symptoms that radiated across the chest at rest no nausea vomiting or diarrhea she has had some shortness of breath she has had recurrent symptoms at rest with mild abdominal discomfort as well EKG was uninterpretable because of space so with her recurrent symptoms she was referred to cardiology and admission for further evaluation   Past Medical History:  Diagnosis Date  . Diabetes mellitus without complication ([place])   . Dyspnea    chronic doe  . Dysrhythmia    afib / block  . Edema    pedal  . GERD (gastroesophageal reflux disease)   . History of orthopnea   . Hypertension   . Sick sinus syndrome ([place])   . Stroke [facility])    tia 2 years ago    Past Surgical History:  Procedure Laterality Date  . BACK SURGERY     disc repair? unsure  . EYE SURGERY Left   . PACEMAKER INSERTION N/A 02/25/2018   Procedure: INSERTION PACEMAKER- INITIAL DUAL CHAMBER;  Surgeon: [name], MD;  Location: [facility] ORS;  Service: Cardiovascular;  Laterality: N/A;  . SHOULDER ARTHROSCOPY Right    x2    No family history on file.  Social History:  reports that she has never smoked. She has never used smokeless tobacco. She reports that she does not drink alcohol or use drugs.  Allergies: No Known Allergies  Medications: I have reviewed the patient's current medications.  Results for orders placed or performed during the hospital encounter of 04/12/18 (from the past 48 hour(s))  Basic metabolic panel     Status: Abnormal   Collection Time: 04/12/18 12:48 AM  Result Value Ref Range   Sodium 135 135 - 145 mmol/L    Potassium 4.9 3.5 - 5.1 mmol/L   Chloride 96 (L) 98 - 111 mmol/L   CO2 25 22 - 32 mmol/L   Glucose, Bld 169 (H) 70 - 99 mg/dL   BUN 25 (H) 8 - 23 mg/dL   Creatinine, Ser 0.84 0.44 - 1.00 mg/dL   Calcium 9.5 8.9 - 10.3 mg/dL   GFR calc non Af Amer 57 (L) >60 mL/min   GFR calc Af Amer >60 >60 mL/min    Comment: (NOTE) The eGFR has been calculated using the CKD EPI equation. This calculation has not been validated in all clinical situations. eGFR's persistently <60 mL/min signify possible Chronic Kidney Disease.    Anion gap 14 5 - 15    Comment: Performed at [facility], [address] [place], [place] [address]  CBC     Status: Abnormal   Collection Time: 04/12/18 12:48 AM  Result Value Ref Range   WBC 19.4 (H) 3.6 - 11.0 K/uL   RBC 3.96 3.80 - 5.20 MIL/uL   Hemoglobin 12.5 12.0 - 16.0 g/dL   HCT 38.4 35.0 - 47.0 %   MCV 97.0 80.0 - 100.0 fL   MCH 31.5 26.0 - 34.0 pg   MCHC 32.5 32.0 - 36.0 g/dL   RDW 13.5 11.5 - 14.5 %   Platelets 345 150 - 440 K/uL    Comment: Performed at [facility], [address] [place],  [place] [address]  Troponin I     Status: None   Collection Time: 04/12/18 12:48 AM  Result Value Ref Range   Troponin I <0.03 <0.03 ng/mL    Comment: Performed at [facility], [address] [place], [place] [address]  Hepatic function panel     Status: None   Collection Time: 04/12/18 12:48 AM  Result Value Ref Range   Total Protein 6.9 6.5 - 8.1 g/dL   Albumin 4.2 3.5 - 5.0 g/dL   AST 19 15 - 41 U/L   ALT 10 0 - 44 U/L   Alkaline Phosphatase 78 38 - 126 U/L   Total Bilirubin 0.6 0.3 - 1.2 mg/dL   Bilirubin, Direct <0.1 0.0 - 0.2 mg/dL   Indirect Bilirubin NOT CALCULATED 0.3 - 0.9 mg/dL    Comment: Performed at [facility], [address] [place], [place] [address]  Lipase, blood     Status: None   Collection Time: 04/12/18 12:48 AM  Result Value Ref Range   Lipase 34 11 - 51 U/L    Comment: Performed at  [facility], [address] [place], [place] [address]  Lactic acid, plasma     Status: Abnormal   Collection Time: 04/12/18  3:35 AM  Result Value Ref Range   Lactic Acid, Venous 2.3 (HH) 0.5 - 1.9 mmol/L    Comment: CRITICAL RESULT CALLED TO, READ BACK BY AND VERIFIED WITH KARA CAMPBELL ON 04/12/18 AT 0413 [facility] Performed at [facility], [address] [place], [place] [address]   Urinalysis, Routine w reflex microscopic     Status: Abnormal   Collection Time: 04/12/18  4:29 AM  Result Value Ref Range   Color, Urine YELLOW (A) YELLOW   APPearance CLEAR (A) CLEAR   Specific Gravity, Urine >1.046 (H) 1.005 - 1.030   pH 5.0 5.0 - 8.0   Glucose, UA NEGATIVE NEGATIVE mg/dL   Hgb urine dipstick NEGATIVE NEGATIVE   Bilirubin Urine NEGATIVE NEGATIVE   Ketones, ur NEGATIVE NEGATIVE mg/dL   Protein, ur NEGATIVE NEGATIVE mg/dL   Nitrite NEGATIVE NEGATIVE   Leukocytes, UA NEGATIVE NEGATIVE    Comment: Performed at [facility], [address] [place], [place] [address]  TSH     Status: None   Collection Time: 04/12/18  5:34 AM  Result Value Ref Range   TSH 1.248 0.350 - 4.500 uIU/mL    Comment: Performed by a 3rd Generation assay with a functional sensitivity of <=0.01 uIU/mL. Performed at [facility], [address] [place], [place] [address]   Hemoglobin A1c     Status: Abnormal   Collection Time: 04/12/18  5:34 AM  Result Value Ref Range   Hgb A1c MFr Bld 6.9 (H) 4.8 - 5.6 %    Comment: (NOTE) Pre diabetes:          5.7%-6.4% Diabetes:              >6.4% Glycemic control for   <7.0% adults with diabetes    Mean Plasma Glucose 151.33 mg/dL    Comment: Performed at [facility] [address], [place], [place] [address]  Troponin I     Status: None   Collection Time: 04/12/18  5:34 AM  Result Value Ref Range   Troponin I <0.03 <0.03 ng/mL    Comment: Performed at [facility], [place], [place] [address]   Lactic acid, plasma     Status: Abnormal  Collection Time: 04/12/18  5:35 AM  Result Value Ref Range   Lactic Acid, Venous 3.5 (HH) 0.5 - 1.9 mmol/L    Comment: CRITICAL RESULT CALLED TO, READ BACK BY AND VERIFIED WITH KERRA CAMPBELL_0  ON 04/12/18 BY HKP Performed at [facility], [address] [place], [place] [address]   Procalcitonin     Status: None   Collection Time: 04/12/18  5:35 AM  Result Value Ref Range   Procalcitonin <0.10 ng/mL    Comment:        Interpretation: PCT (Procalcitonin) <= 0.5 ng/mL: Systemic infection (sepsis) is not likely. Local bacterial infection is possible. (NOTE)       Sepsis PCT Algorithm           Lower Respiratory Tract                                      Infection PCT Algorithm    ----------------------------     ----------------------------         PCT < 0.25 ng/mL                PCT < 0.10 ng/mL         Strongly encourage             Strongly discourage   discontinuation of antibiotics    initiation of antibiotics    ----------------------------     -----------------------------       PCT 0.25 - 0.50 ng/mL            PCT 0.10 - 0.25 ng/mL               OR       >80% decrease in PCT            Discourage initiation of                                            antibiotics      Encourage discontinuation           of antibiotics    ----------------------------     -----------------------------         PCT >= 0.50 ng/mL              PCT 0.26 - 0.50 ng/mL               AND        <80% decrease in PCT             Encourage initiation of                                             antibiotics       Encourage continuation           of antibiotics    ----------------------------     -----------------------------        PCT >= 0.50 ng/mL                  PCT > 0.50 ng/mL               AND         increase in PCT  Strongly encourage                                      initiation of antibiotics    Strongly  encourage escalation           of antibiotics                                     -----------------------------                                           PCT <= 0.25 ng/mL                                                 OR                                        > 80% decrease in PCT                                     Discontinue / Do not initiate                                             antibiotics Performed at [facility], [address] [place], [place] [address]   Glucose, capillary     Status: Abnormal   Collection Time: 04/12/18  8:01 AM  Result Value Ref Range   Glucose-Capillary 166 (H) 70 - 99 mg/dL   Comment 1 Notify RN    Comment 2 Document in Chart   Troponin I     Status: None   Collection Time: 04/12/18  9:14 AM  Result Value Ref Range   Troponin I <0.03 <0.03 ng/mL    Comment: Performed at [facility], [address] [place], [place] [address]  Lactic acid, plasma     Status: None   Collection Time: 04/12/18  9:14 AM  Result Value Ref Range   Lactic Acid, Venous 1.9 0.5 - 1.9 mmol/L    Comment: Performed at [facility], [address] [place], [place] [address]  Glucose, capillary     Status: Abnormal   Collection Time: 04/12/18 11:43 AM  Result Value Ref Range   Glucose-Capillary 164 (H) 70 - 99 mg/dL   Comment 1 Notify RN    Comment 2 Document in Chart   Lactic acid, plasma     Status: Abnormal   Collection Time: 04/12/18 12:43 PM  Result Value Ref Range   Lactic Acid, Venous 2.8 (HH) 0.5 - 1.9 mmol/L    Comment: CRITICAL RESULT CALLED TO, READ BACK BY AND VERIFIED WITH JENNIFER GREY_0  ON 04/12/18 BY HKP Performed at [facility], [address], [place], [place] [address]     Ct Angio Chest Pe W And/or Wo Contrast  Result  Date: 04/12/2018 CLINICAL DATA:  Chest pain with radiation to left arm and neck. Weakness and shortness of breath. EXAM: CT ANGIOGRAPHY CHEST WITH CONTRAST TECHNIQUE: Multidetector CT  imaging of the chest was performed using the standard protocol during bolus administration of intravenous contrast. Multiplanar CT image reconstructions and MIPs were obtained to evaluate the vascular anatomy. CONTRAST:  39m OMNIPAQUE IOHEXOL 350 MG/ML SOLN COMPARISON:  Chest x-ray April 12, 2018. CT of the chest Nov 05, 2012. FINDINGS: Cardiovascular: The heart size is borderline. The thoracic aorta demonstrates atherosclerotic change but no aneurysm or dissection. Coronary artery calcifications are noted. No pulmonary emboli. Mediastinum/Nodes: No effusion. Suggested mild thickening of the distal esophagus. No adenopathy. The pacemaker generator is seen in the left anterior chest. Lungs/Pleura: Central airways are normal. No pneumothorax. Mild dependent atelectasis. No suspicious infiltrate, nodule, or mass. Upper Abdomen: Thickening of the left adrenal gland is likely due to hyperplasia. No other abnormalities in the upper abdomen. Musculoskeletal: No chest wall abnormality. No acute or significant osseous findings. Review of the MIP images confirms the above findings. IMPRESSION: 1. No pulmonary emboli. 2. Suggested thickening of the distal esophagus may represent sequela of reflux or esophagitis. 3. Atherosclerotic change in the thoracic aorta. Coronary artery calcifications. 4. Probable left adrenal hyperplasia. Aortic Atherosclerosis (ICD10-I70.0). Electronically Signed   By: DDorise BullionIII M.Trevino   On: 04/12/2018 02:53   Dg Chest Portable 1 View  Result Date: 04/12/2018 CLINICAL DATA:  Chest pain. EXAM: PORTABLE CHEST 1 VIEW COMPARISON:  Radiograph 02/25/2018 FINDINGS: Left-sided pacemaker in place. Unchanged heart size and mediastinal contours. Aortic atherosclerosis. Mild interstitial coarsening that is chronic. No pulmonary edema, focal airspace disease, large pleural effusion or pneumothorax. Bones are under mineralized. IMPRESSION: No acute findings. Electronically Signed   By: MKeith Rake M.Trevino.   On: 04/12/2018 01:37    Review of Systems  Constitutional: Positive for diaphoresis and malaise/fatigue.  Eyes: Negative.   Respiratory: Negative.   Cardiovascular: Negative.   Gastrointestinal: Negative.   Genitourinary: Negative.   Musculoskeletal: Negative.   Neurological: Negative.   Endo/Heme/Allergies: Negative.   Psychiatric/Behavioral: Negative.    Blood pressure (!) 101/57, pulse 70, temperature (!) 97.3 F (36.3 C), temperature source Oral, resp. rate 18, height _0  (1.626 m), weight 52.1 kg, SpO2 91 %. Physical Exam  Nursing note and vitals reviewed. Constitutional: She is oriented to person, place, and time. She appears well-developed and well-nourished.  HENT:  Head: Normocephalic and atraumatic.  Eyes: Pupils are equal, round, and reactive to light. Conjunctivae and EOM are normal.  Neck: Normal range of motion. Neck supple.  Cardiovascular: Normal rate, regular rhythm and normal heart sounds.  Respiratory: Effort normal and breath sounds normal.  GI: Soft. Bowel sounds are normal.  Musculoskeletal: Normal range of motion.  Neurological: She is alert and oriented to person, place, and time. She has normal reflexes.  Skin: Skin is warm and dry.  Psychiatric: She has a normal mood and affect.    Assessment/Plan: Chest pain Sepsis Sick sinus syndrome Permanent pacemaker in place Arrhythmia Gout Diabetes Elevated lactic acid . Plan Agree with admit rule out for microinfarction Continue broad-spectrum antibiotic therapy Continue Eliquis therapy for anticoagulation DVT prophylaxis Recommend conservative medical therapy Agree with diabetes management and control hold metformin for now use sliding scale Conservative cardiac input   Misty Trevino Gabby Rackers 04/12/2018, 2:35 PM

## 2018-04-12 NOTE — Progress Notes (Addendum)
Advanced Care Plan.  Purpose of Encounter: Code status. Parties in Attendance: The patient, her son, daughter-in-law and me. Patient's Decisional Capacity: Yes. Medical Story:  Misty Trevino is an 84 y.o. female. She has past medical history of sick sinus syndrome status post pacemaker placement 6 weeks ago, diabetes, CVA, GERD and hypertension.  She is admitted for chest pain and sepsis.  I discussed with the patient about her current condition, prognosis and CODE STATUS.  She does not want to be resuscitated or intubated.  I changed CODE STATUS from full code to DNR.  Plan:  Code Status: Full code Time spent discussing advance care planning: 18 minutes.

## 2018-04-12 NOTE — ED Notes (Signed)
Dr. Clearnce Hasten in to speak with pt and family.

## 2018-04-13 ENCOUNTER — Inpatient Hospital Stay: Payer: Medicare Other

## 2018-04-13 DIAGNOSIS — R079 Chest pain, unspecified: Secondary | ICD-10-CM

## 2018-04-13 DIAGNOSIS — R002 Palpitations: Secondary | ICD-10-CM

## 2018-04-13 DIAGNOSIS — D72829 Elevated white blood cell count, unspecified: Secondary | ICD-10-CM

## 2018-04-13 DIAGNOSIS — L309 Dermatitis, unspecified: Secondary | ICD-10-CM

## 2018-04-13 DIAGNOSIS — I441 Atrioventricular block, second degree: Secondary | ICD-10-CM

## 2018-04-13 DIAGNOSIS — Z950 Presence of cardiac pacemaker: Secondary | ICD-10-CM

## 2018-04-13 DIAGNOSIS — R238 Other skin changes: Secondary | ICD-10-CM

## 2018-04-13 LAB — CBC
HCT: 32.9 % — ABNORMAL LOW (ref 35.0–47.0)
Hemoglobin: 10.8 g/dL — ABNORMAL LOW (ref 12.0–16.0)
MCH: 31.7 pg (ref 26.0–34.0)
MCHC: 32.7 g/dL (ref 32.0–36.0)
MCV: 96.9 fL (ref 80.0–100.0)
PLATELETS: 269 10*3/uL (ref 150–440)
RBC: 3.4 MIL/uL — AB (ref 3.80–5.20)
RDW: 13.8 % (ref 11.5–14.5)
WBC: 14.9 10*3/uL — AB (ref 3.6–11.0)

## 2018-04-13 LAB — GLUCOSE, CAPILLARY
Glucose-Capillary: 191 mg/dL — ABNORMAL HIGH (ref 70–99)
Glucose-Capillary: 181 mg/dL — ABNORMAL HIGH (ref 70–99)
Glucose-Capillary: 132 mg/dL — ABNORMAL HIGH (ref 70–99)
Glucose-Capillary: 121 mg/dL — ABNORMAL HIGH (ref 70–99)

## 2018-04-13 LAB — BASIC METABOLIC PANEL
Anion gap: 10 (ref 5–15)
BUN: 20 mg/dL (ref 8–23)
CO2: 23 mmol/L (ref 22–32)
Calcium: 8.5 mg/dL — ABNORMAL LOW (ref 8.9–10.3)
Chloride: 104 mmol/L (ref 98–111)
Creatinine, Ser: 0.88 mg/dL (ref 0.44–1.00)
GFR calc Af Amer: >60 mL/min (ref >60)
GFR, EST NON AFRICAN AMERICAN: 54 mL/min — AB (ref >60)
Glucose, Bld: 175 mg/dL — ABNORMAL HIGH (ref 70–99)
POTASSIUM: 4 mmol/L (ref 3.5–5.1)
SODIUM: 137 mmol/L (ref 135–145)

## 2018-04-13 LAB — LACTIC ACID, PLASMA: LACTIC ACID, VENOUS: 1.4 mmol/L (ref 0.5–1.9)

## 2018-04-13 LAB — MRSA PCR SCREENING: MRSA by PCR: NEGATIVE

## 2018-04-13 MED ORDER — OXYCODONE-ACETAMINOPHEN 5-325 MG PO TABS
1.0000 | ORAL_TABLET | Freq: Once | ORAL | Status: AC
  Administered 2018-04-13: 1 via ORAL
  Filled 2018-04-13: qty 1

## 2018-04-13 MED ORDER — NITROGLYCERIN 2 % TD OINT
0.5000 [in_us] | TOPICAL_OINTMENT | Freq: Four times a day (QID) | TRANSDERMAL | Status: DC
  Administered 2018-04-13 – 2018-04-20 (×15): 0.5 [in_us] via TOPICAL
  Filled 2018-04-13 (×16): qty 1

## 2018-04-13 MED ORDER — SODIUM CHLORIDE 0.9 % IV SOLN
INTRAVENOUS | Status: DC | PRN
  Administered 2018-04-13 – 2018-04-15 (×2): 500 mL via INTRAVENOUS

## 2018-04-13 NOTE — Consult Note (Signed)
NAME: Misty Trevino  DOB: 1921/04/18  MRN: [id number]  Date/Time: 04/13/2018 11:35 AM Dr. Bridgett Larsson Subjective:  REASON FOR CONSULT: Sepsis ? Misty Trevino is a 84 y.o. female with a history of second-degree AV blockunderwent dual-chamber permanent pacemaker placement on 02/25/2018. She is admitted with chest pain. Patient lives on her own.  She has been followed by cardiologist.  She has had fatigue and weakness and was found to have bradycardia due to intermittent type II second-degree AV block.  She had a pacemaker placed by cardiologist on 02/25/2018.  The surgical wound healed well.  Last Saturday started developing chest pain she is which she describes his pain from the throat down to the abdomen.  It was not associated with cough or shortness of breath.  The pain did radiate all over the chest.  She called her son and was brought to the hospital. In the EDShe was noted to have a temperature of 97 heart rate of 101, blood pressure 137/44.  Chest x-ray was normal.  She underwent a CTA which did not reveal any pulmonary embolism.  There was thickening of the distal esophagus suggestive of reflux esophagitis.She had a WBC of 19.4 and lactate of 2.3 which up to went up to 3.5, procalcitonin was less than 0.10.  Troponin was normal.   She was initially started on ceftriaxone and Zithromax and then changed to vancomycin and also got a dose of Zosyn and now she is currently on Zosyn Patient denies any abdominal pain, diarrhea, constipation or cough or shortness of breath or headache or dysuria. She has a cat.  Which is 58 years old.  No bites from the cat.  A small scratch on the right arm. Past Medical History:  Diagnosis Date  . Diabetes mellitus without complication ([place])   . Dyspnea    chronic doe  . Dysrhythmia    afib / block  . Edema    pedal  . GERD (gastroesophageal reflux disease)   . History of orthopnea   . Hypertension   . Sick sinus syndrome ([place])   . Stroke [facility])    tia 2  years ago    Past Surgical History:  Procedure Laterality Date  . BACK SURGERY     disc repair? unsure  . EYE SURGERY Left   . PACEMAKER INSERTION N/A 02/25/2018   Procedure: INSERTION PACEMAKER- INITIAL DUAL CHAMBER;  Surgeon: [name], MD;  Location: [facility] ORS;  Service: Cardiovascular;  Laterality: N/A;  . SHOULDER ARTHROSCOPY Right    x2      Current Facility-Administered Medications  Medication Dose Route Frequency Provider Last Rate Last Dose  . 0.9 %  sodium chloride infusion   Intravenous PRN Demetrios Loll, MD   Stopped at 04/13/18 [phone]  . acetaminophen (TYLENOL) tablet 650 mg  650 mg Oral Q6H PRN Harrie Foreman, MD   650 mg at 04/12/18 1510   Or  . acetaminophen (TYLENOL) suppository 650 mg  650 mg Rectal Q6H PRN Harrie Foreman, MD      . allopurinol (ZYLOPRIM) tablet 100 mg  100 mg Oral QPM Harrie Foreman, MD   100 mg at 04/12/18 1746  . apixaban (ELIQUIS) tablet 2.5 mg  2.5 mg Oral BID Harrie Foreman, MD   2.5 mg at 04/13/18 7846  . cholecalciferol (VITAMIN D) tablet 1,000 Units  1,000 Units Oral Daily Harrie Foreman, MD   1,000 Units at 04/13/18 [phone]  . docusate sodium (COLACE) capsule 100 mg  100  mg Oral BID Harrie Foreman, MD   100 mg at 04/12/18 2123  . insulin aspart (novoLOG) injection 0-5 Units  0-5 Units Subcutaneous QHS Harrie Foreman, MD   2 Units at 04/12/18 2126  . insulin aspart (novoLOG) injection 0-9 Units  0-9 Units Subcutaneous TID WC Harrie Foreman, MD   2 Units at 04/13/18 0815  . MEDLINE mouth rinse  15 mL Mouth Rinse BID Harrie Foreman, MD   15 mL at 04/13/18 6606  . nitroGLYCERIN (NITROGLYN) 2 % ointment 0.5 inch  0.5 inch Topical Q6H Harrie Foreman, MD   0.5 inch at 04/13/18 0542  . ondansetron (ZOFRAN) tablet 4 mg  4 mg Oral Q6H PRN Harrie Foreman, MD       Or  . ondansetron [facility]) injection 4 mg  4 mg Intravenous Q6H PRN Harrie Foreman, MD      . piperacillin-tazobactam (ZOSYN) IVPB 3.375 g   3.375 g Intravenous Robynn Pane, MD 12.5 mL/hr at 04/13/18 0547 3.375 g at 04/13/18 0547  . polyvinyl alcohol (LIQUIFILM TEARS) 1.4 % ophthalmic solution 1 drop  1 drop Both Eyes QPM Harrie Foreman, MD   1 drop at 04/12/18 2125  . triamcinolone cream (KENALOG) 0.1 % 1 application  1 application Topical QHS Harrie Foreman, MD   1 application at 30/16/01 2124  . vancomycin (VANCOCIN) IVPB 750 mg/150 ml premix  750 mg Intravenous Q24H Demetrios Loll, MD   Stopped at 04/13/18 0753     Abtx:  Anti-infectives (From admission, onward)   Start     Dose/Rate Route Frequency Ordered Stop   04/13/18 0600  vancomycin (VANCOCIN) IVPB 750 mg/150 ml premix     750 mg 150 mL/hr over 60 Minutes Intravenous Every 24 hours 04/12/18 1200     04/12/18 1400  piperacillin-tazobactam (ZOSYN) IVPB 3.375 g     3.375 g 12.5 mL/hr over 240 Minutes Intravenous Every 8 hours 04/12/18 1129     04/12/18 1145  vancomycin (VANCOCIN) IVPB 750 mg/150 ml premix     750 mg 150 mL/hr over 60 Minutes Intravenous  Once 04/12/18 1133 04/12/18 1424   04/12/18 0800  azithromycin (ZITHROMAX) 500 mg in sodium chloride 0.9 % 250 mL IVPB  Status:  Discontinued     500 mg 250 mL/hr over 60 Minutes Intravenous Every 24 hours 04/12/18 0537 04/12/18 1123   04/12/18 0600  cefTRIAXone (ROCEPHIN) 1 g in sodium chloride 0.9 % 100 mL IVPB  Status:  Discontinued     1 g 200 mL/hr over 30 Minutes Intravenous Every 24 hours 04/12/18 0537 04/12/18 1123      REVIEW OF SYSTEMS:  Const: negative fever, negative chills, negative weight loss Eyes: negative diplopia or visual changes, negative eye pain ENT: negative coryza, negative sore throat Resp: negative cough, hemoptysis, dyspnea Cards:  chest pain, palpitations, no lower extremity edema GU: negative for frequency, dysuria and hematuria GI: Negative for abdominal pain, diarrhea, bleeding, constipation Skin: Dry skin with history of eczema Heme:  easy bruising of skin  MS:  Generalized weakness, joint pain Neurolo:negative for headaches, dizziness, vertigo, memory problems  Psych: negative for feelings of anxiety, depression  Endocrine: Polyuria or polydipsia allergy/Immunology- negative for any medication or food allergies ? Pertinent Positives include : Objective:  VITALS:  BP 136/61 (BP Location: Left Arm)   Pulse 83   Temp 98.6 F (37 C) (Oral)   Resp 15   Ht 5\' 4"  (1.626 m)   Wt  53.9 kg   SpO2 (!) 89%   BMI 20.39 kg/m   PHYSICAL EXAM:  General: Alert, cooperative, no distress, pale and pale Head: Normocephalic, without obvious abnormality, atraumatic. Eyes: Conjunctivae clear, anicteric sclerae. Pupils are equal ENT Nares normal. No drainage or sinus tenderness. Lips, mucosa, and tongue normal. No Thrush Neck: Supple, symmetrical, no adenopathy, thyroid: non tender no carotid bruit and no JVD. Back: No CVA tenderness. Lungs: Bilateral air entry.  No crepitation Heart: Regular rate and rhythm, no murmur, rub or gallop. Pacemaker site healed very well.  No erythema, tenderness or swelling or discharge. Abdomen: Soft, non-tender,not distended. Bowel sounds normal. No masses Extremities: atraumatic, no cyanosis. No edema. No clubbing Skin: Skin over the legs very dry with eczematous patches especially on the right ankle on the lateral aspect and left ankle on the lateral aspect.  There is also raised lesion on the right leg which looks like a BCC Lymph: Cervical, supraclavicular normal. Neurologic: Grossly non-focal Pertinent Labs CBC Latest Ref Rng & Units 04/13/2018 04/12/2018 02/19/2018  WBC 3.6 - 11.0 K/uL 14.9(H) 19.4(H) 13.4(H)  Hemoglobin 12.0 - 16.0 g/dL 10.8(L) 12.5 14.0  Hematocrit 35.0 - 47.0 % 32.9(L) 38.4 40.9  Platelets 150 - 440 K/uL 269 345 309   CMP Latest Ref Rng & Units 04/13/2018 04/12/2018 02/19/2018  Glucose 70 - 99 mg/dL 175(H) 169(H) 191(H)  BUN 8 - 23 mg/dL 20 25(H) 41(H)  Creatinine 0.44 - 1.00 mg/dL 0.88 0.84 1.14(H)    Sodium 135 - 145 mmol/L 137 135 139  Potassium 3.5 - 5.1 mmol/L 4.0 4.9 4.6  Chloride 98 - 111 mmol/L 104 96(L) 102  CO2 22 - 32 mmol/L 23 25 26   Calcium 8.9 - 10.3 mg/dL 8.5(L) 9.5 9.6  Total Protein 6.5 - 8.1 g/dL - 6.9 -  Total Bilirubin 0.3 - 1.2 mg/dL - 0.6 -  Alkaline Phos 38 - 126 U/L - 78 -  AST 15 - 41 U/L - 19 -  ALT 0 - 44 U/L - 10 -  UA no leukocytes or nitrites  IMAGING RESULTS: ? Impression/Recommendation ?84 y.o. female with a history of second-degree AV blockunderwent dual-chamber permanent pacemaker placement on 02/25/2018. She is admitted with chest pain.   Chest pain, leukocytosis and increase in lactate. EKG showed paced rhythm Troponin normal Is unclear why she has leukocytosis.  At first blush it would appear that because of the recent pacemaker concern for a pacemaker site infection.  But the site looks very clean and has healed very well. There is no evidence of UTI or pneumonia. She does have skin eczema but that does not look infected. She is currently on Zosyn.( She received ceftriaxone, Zithromax and a dose of vancomycin). Blood cultures remain negative so far.  If it remains negative in 48 hours then we will DC antibiotics. ? ?Second-degree AV block status post pacemaker in August 2019.  Eczema of the legs. needs moisturizer  Thin skin and bruises  on the arms.  A small scratch(from her cat) with the scab present on the right arm but there is no evidence of any infection there.  No axillary adenopathy. ___________________________________________________ Discussed with patient and her son.

## 2018-04-13 NOTE — Plan of Care (Signed)
  Problem: Clinical Measurements: Goal: Diagnostic test results will improve Outcome: Progressing Note:  [place] down to 1.4, MRSA swab negative   Problem: Clinical Measurements: Goal: Diagnostic test results will improve Outcome: Progressing Note:  [place] down to 1.4    Problem: Clinical Measurements: Goal: Will remain free from infection Outcome: Not Progressing Note:  Remains on antibiotics

## 2018-04-13 NOTE — Plan of Care (Signed)
  Problem: Activity: Goal: Risk for activity intolerance will decrease Outcome: Progressing   Problem: Elimination: Goal: Will not experience complications related to bowel motility Outcome: Progressing Goal: Will not experience complications related to urinary retention Outcome: Progressing   Problem: Safety: Goal: Ability to remain free from injury will improve Outcome: Progressing   Problem: Skin Integrity: Goal: Risk for impaired skin integrity will decrease Outcome: Progressing   Problem: Activity: Goal: Ability to tolerate increased activity will improve Outcome: Progressing

## 2018-04-13 NOTE — Progress Notes (Addendum)
[place] at [place] NAME: Misty Trevino    MR#:  [id number]  DATE OF BIRTH:  October 11, 1920  SUBJECTIVE:  CHIEF COMPLAINT:   Chief Complaint  Patient presents with  . Chest Pain   The patient has no complaints.  She has hypoxia, put on oxygen by nasal cannula 3 L. REVIEW OF SYSTEMS:  Review of Systems  Constitutional: Negative for chills, fever and malaise/fatigue.  HENT: Negative for sore throat.   Eyes: Negative for blurred vision and double vision.  Respiratory: Negative for cough, hemoptysis, shortness of breath, wheezing and stridor.   Cardiovascular: Negative for chest pain, palpitations, orthopnea and leg swelling.  Gastrointestinal: Negative for abdominal pain, blood in stool, diarrhea, melena, nausea and vomiting.  Genitourinary: Negative for dysuria, flank pain and hematuria.  Musculoskeletal: Negative for back pain and joint pain.  Skin: Negative for rash.  Neurological: Negative for dizziness, sensory change, focal weakness, seizures, loss of consciousness, weakness and headaches.  Endo/Heme/Allergies: Negative for polydipsia.  Psychiatric/Behavioral: Negative for depression. The patient is not nervous/anxious.     DRUG ALLERGIES:  No Known Allergies VITALS:  Blood pressure 136/61, pulse 83, temperature 98.6 F (37 C), temperature source Oral, resp. rate 15, height 5\' 4"  (1.626 m), weight 53.9 kg, SpO2 (!) 89 %. PHYSICAL EXAMINATION:  Physical Exam  Constitutional: She is oriented to person, place, and time. No distress.  Malnutrition.  HENT:  Head: Normocephalic.  Mouth/Throat: Oropharynx is clear and moist.  Eyes: Pupils are equal, round, and reactive to light. Conjunctivae and EOM are normal. No scleral icterus.  Neck: Normal range of motion. Neck supple. No JVD present. No tracheal deviation present.  Cardiovascular: Normal rate, regular rhythm and normal heart sounds. Exam reveals no gallop.  No murmur  heard. Pulmonary/Chest: Effort normal. No stridor. No respiratory distress. She has no wheezes. She has no rales.  Bilateral base crackles.  Abdominal: Soft. Bowel sounds are normal. She exhibits no distension. There is no tenderness. There is no rebound.  Musculoskeletal: Normal range of motion. She exhibits no edema or tenderness.  Neurological: She is alert and oriented to person, place, and time. No cranial nerve deficit.  Skin: No rash noted. No erythema.  Psychiatric: She has a normal mood and affect.   LABORATORY PANEL:  Female CBC Recent Labs  Lab 04/13/18 0508  WBC 14.9*  HGB 10.8*  HCT 32.9*  PLT 269   ------------------------------------------------------------------------------------------------------------------ Chemistries  Recent Labs  Lab 04/12/18 0048 04/13/18 0508  NA 135 137  K 4.9 4.0  CL 96* 104  CO2 25 23  GLUCOSE 169* 175*  BUN 25* 20  CREATININE 0.84 0.88  CALCIUM 9.5 8.5*  AST 19  --   ALT 10  --   ALKPHOS 78  --   BILITOT 0.6  --    RADIOLOGY:  No results found. ASSESSMENT AND PLAN:   84 year old female admitted for chest pain.  1. Chest pain:  Improved. Normal cardiac biomarkers. Recommend conservative medical therapy per Dr. Clayborn Bigness.  2. Sepsis:Source of infection is unclear at this time. Follow CBC and blood cultures for growth and sensitivity.   Continue Zosyn.  Discontinue vancomycin due to negative MRSA screen. Zithromax and Rocephin were discontinued. ID consult.  Lactic acidosis due to above.    Improved.  3. Arrhythmia: Continue Eliquis 4. Diabetes mellitus type 2: Hold metformin for now. Sliding scale insulin while hospitalized.  5. Gout: Continue allopurinol.   Acute respiratory failure with hypoxia, possible  due to sepsis. Continue oxygen by nasal cannula, DuoNeb as needed, repeated chest x-ray.  All the records are reviewed and case discussed with Care Management/Social Worker. Management plans discussed  with the patient, her son, daughter-in-law, and they are in agreement.  CODE STATUS: DNR  TOTAL TIME TAKING CARE OF THIS PATIENT: 33 minutes.   More than 50% of the time was spent in counseling/coordination of care: YES  POSSIBLE D/C IN 2 DAYS, DEPENDING ON CLINICAL CONDITION.   Demetrios Loll M.D on 04/13/2018 at 2:13 PM  Between 7am to 6pm - Pager - [phone]  After 6pm go to [email or web address] - [facility]

## 2018-04-14 ENCOUNTER — Inpatient Hospital Stay
Admit: 2018-04-14 | Discharge: 2018-04-14 | Disposition: A | Discharge status: Home / Self Care | Payer: Medicare Other | Attending: Internal Medicine | Admitting: Internal Medicine

## 2018-04-14 LAB — CBC
HCT: 32.7 % — ABNORMAL LOW (ref 35.0–47.0)
HEMOGLOBIN: 10.8 g/dL — AB (ref 12.0–16.0)
MCH: 32.5 pg (ref 26.0–34.0)
MCHC: 33.1 g/dL (ref 32.0–36.0)
MCV: 98.1 fL (ref 80.0–100.0)
Platelets: 289 10*3/uL (ref 150–440)
RBC: 3.33 MIL/uL — AB (ref 3.80–5.20)
RDW: 13.7 % (ref 11.5–14.5)
WBC: 15.8 10*3/uL — ABNORMAL HIGH (ref 3.6–11.0)

## 2018-04-14 LAB — GLUCOSE, CAPILLARY
Glucose-Capillary: 118 mg/dL — ABNORMAL HIGH (ref 70–99)
Glucose-Capillary: 170 mg/dL — ABNORMAL HIGH (ref 70–99)
Glucose-Capillary: 128 mg/dL — ABNORMAL HIGH (ref 70–99)

## 2018-04-14 LAB — ECHOCARDIOGRAM COMPLETE
HEIGHTINCHES: 64 in
WEIGHTICAEL: 1905.6 [oz_av]

## 2018-04-14 MED ORDER — CARVEDILOL 3.125 MG PO TABS
3.1250 mg | ORAL_TABLET | Freq: Two times a day (BID) | ORAL | Status: DC
  Administered 2018-04-14 – 2018-04-20 (×12): 3.125 mg via ORAL
  Filled 2018-04-14 (×13): qty 1

## 2018-04-14 MED ORDER — GUAIFENESIN-DM 100-10 MG/5ML PO SYRP
5.0000 mL | ORAL_SOLUTION | ORAL | Status: DC | PRN
  Administered 2018-04-14: 5 mL via ORAL
  Filled 2018-04-14: qty 5

## 2018-04-14 MED ORDER — LISINOPRIL 5 MG PO TABS
2.5000 mg | ORAL_TABLET | Freq: Every day | ORAL | Status: DC
  Administered 2018-04-14 – 2018-04-20 (×7): 2.5 mg via ORAL
  Filled 2018-04-14 (×7): qty 1

## 2018-04-14 MED ORDER — DIPHENHYDRAMINE HCL 25 MG PO CAPS
25.0000 mg | ORAL_CAPSULE | Freq: Every evening | ORAL | Status: DC | PRN
  Administered 2018-04-14 – 2018-04-19 (×3): 25 mg via ORAL
  Filled 2018-04-14 (×3): qty 1

## 2018-04-14 MED ORDER — FUROSEMIDE 10 MG/ML IJ SOLN
20.0000 mg | Freq: Two times a day (BID) | INTRAMUSCULAR | Status: DC
  Administered 2018-04-14 – 2018-04-17 (×6): 20 mg via INTRAVENOUS
  Filled 2018-04-14 (×7): qty 2

## 2018-04-14 NOTE — Progress Notes (Addendum)
[place] at [place] NAME: Misty Trevino    MR#:  [id number]  DATE OF BIRTH:  [birth year]  SUBJECTIVE:  CHIEF COMPLAINT:   Chief Complaint  Patient presents with  . Chest Pain   The patient has cough and SOB , on oxygen by nasal cannula 3 L. REVIEW OF SYSTEMS:  Review of Systems  Constitutional: Negative for chills, fever and malaise/fatigue.  HENT: Negative for sore throat.   Eyes: Negative for blurred vision and double vision.  Respiratory: Negative for cough, hemoptysis, shortness of breath, wheezing and stridor.   Cardiovascular: Negative for chest pain, palpitations, orthopnea and leg swelling.  Gastrointestinal: Negative for abdominal pain, blood in stool, diarrhea, melena, nausea and vomiting.  Genitourinary: Negative for dysuria, flank pain and hematuria.  Musculoskeletal: Negative for back pain and joint pain.  Skin: Negative for rash.  Neurological: Negative for dizziness, sensory change, focal weakness, seizures, loss of consciousness, weakness and headaches.  Endo/Heme/Allergies: Negative for polydipsia.  Psychiatric/Behavioral: Negative for depression. The patient is not nervous/anxious.     DRUG ALLERGIES:  No Known Allergies VITALS:  Blood pressure 122/67, pulse 83, temperature 98.2 F (36.8 C), temperature source Oral, resp. rate 14, height 5\' 4"  (1.626 m), weight 54 kg, SpO2 90 %. PHYSICAL EXAMINATION:  Physical Exam  Constitutional: She is oriented to person, place, and time. No distress.  Malnutrition.  HENT:  Head: Normocephalic.  Mouth/Throat: Oropharynx is clear and moist.  Eyes: Pupils are equal, round, and reactive to light. Conjunctivae and EOM are normal. No scleral icterus.  Neck: Normal range of motion. Neck supple. No JVD present. No tracheal deviation present.  Cardiovascular: Normal rate, regular rhythm and normal heart sounds. Exam reveals no gallop.  No murmur heard. Pulmonary/Chest: Effort  normal. No stridor. No respiratory distress. She has no wheezes. She has no rales.  Bilateral base crackles.  Abdominal: Soft. Bowel sounds are normal. She exhibits no distension. There is no tenderness. There is no rebound.  Musculoskeletal: Normal range of motion. She exhibits no edema or tenderness.  Neurological: She is alert and oriented to person, place, and time. No cranial nerve deficit.  Skin: No rash noted. No erythema.  Psychiatric: She has a normal mood and affect.   LABORATORY PANEL:  Female CBC Recent Labs  Lab 04/14/18 0448  WBC 15.8*  HGB 10.8*  HCT 32.7*  PLT 289   ------------------------------------------------------------------------------------------------------------------ Chemistries  Recent Labs  Lab 04/12/18 0048 04/13/18 0508  NA 135 137  K 4.9 4.0  CL 96* 104  CO2 25 23  GLUCOSE 169* 175*  BUN 25* 20  CREATININE 0.84 0.88  CALCIUM 9.5 8.5*  AST 19  --   ALT 10  --   ALKPHOS 78  --   BILITOT 0.6  --    RADIOLOGY:  Dg Chest Port 1 View  Result Date: 04/13/2018 CLINICAL DATA:  Chest pain and hypoxia. EXAM: PORTABLE CHEST 1 VIEW COMPARISON:  Chest CTA dated 04/12/2018 and portable chest dated 04/12/2018. FINDINGS: Interval mildly enlarged cardiac silhouette with increased prominence of the pulmonary vasculature and interstitial markings, including Kerley lines. Small bilateral pleural effusions. Stable left subclavian pacemaker leads, thoracic spine degenerative changes and bilateral shoulder degenerative changes. Diffuse osteopenia. IMPRESSION: Interval changes of acute congestive heart failure. Electronically Signed   By: Claudie Revering M.D.   On: 04/13/2018 15:15   ASSESSMENT AND PLAN:   84 year old female admitted for chest pain.  1. Chest pain:  Improved. Normal cardiac  biomarkers. Recommend conservative medical therapy per Dr. Clayborn Bigness.  2. Sepsis:Source of infection is unclear at this time. Follow CBC and blood cultures for growth and  sensitivity.   Continue Zosyn.  Discontinue vancomycin due to negative MRSA screen. Zithromax and Rocephin were discontinued. Per Dr. Steva Ready, follow-up blood culture, may discontinue Zosyn if negative.  Lactic acidosis due to above.    Improved.  3. Arrhythmia: Continue Eliquis 4. Diabetes mellitus type 2: Hold metformin for now. Sliding scale insulin while hospitalized.  5. Gout: Continue allopurinol.   Acute respiratory failure with hypoxia due to acute systolic CHF, LV EF: 68% -   40%. Continue oxygen by nasal cannula, DuoNeb as needed.  Chest x-ray show CHF. Started CHF protocol, Lasix 20 mg IV twice daily, echocardiograph: LV EF: 35% -   40%.  Severe malnutrition.  Follow dietitian.  All the records are reviewed and case discussed with Care Management/Social Worker. Management plans discussed with the patient, her son, daughter-in-law, and they are in agreement.  CODE STATUS: DNR  TOTAL TIME TAKING CARE OF THIS PATIENT: 38 minutes.   More than 50% of the time was spent in counseling/coordination of care: YES  POSSIBLE D/C IN 2-3 DAYS, DEPENDING ON CLINICAL CONDITION.   Demetrios Loll M.D on 04/14/2018 at 9:52 AM  Between 7am to 6pm - Pager - [phone]  After 6pm go to [email or web address] - [facility]

## 2018-04-14 NOTE — Progress Notes (Signed)
Advanced Care Plan.  Purpose of Encounter: Hospice care and palliative care. Parties in Attendance: The patient, her daughter-in-law and me. Patient's Decisional Capacity: Yes. Medical Story:  Misty Trevino is an 84 y.o. female. She has past medical history of sick sinus syndrome status post pacemaker placement 6 weeks ago, diabetes, CVA, GERD and hypertension.  She is admitted for chest pain and sepsis.  She has been treated with antibiotics.  She was found shortness of breath and cough, hypoxia, put on oxygen by nasal cannula 3 L.  Chest x-ray show CHF.  Echocardiogram showing ejection fraction 35 to 40%.  She developed acute respiratory failure due to acute systolic CHF.  I discussed with the patient and her daughter-in-law about her current condition, very poor prognosis, hospice care and palliative care.  They voiced understanding and will discuss. Plan:  Code Status: Full code Time spent discussing advance care planning: 20 minutes.

## 2018-04-14 NOTE — Progress Notes (Signed)
*  PRELIMINARY RESULTS* Echocardiogram 2D Echocardiogram has been performed.  Sherrie Sport 04/14/2018, 3:34 PM

## 2018-04-14 NOTE — Progress Notes (Signed)
? Misty Trevino is a 84 y.o. female with a history of second-degree AV blockunderwent dual-chamber permanent pacemaker placement on 02/25/2018. She is admitted with chest pain. Patient lives on her own.  She has been followed by cardiologist.  She has had fatigue and weakness and was found to have bradycardia due to intermittent type II second-degree AV block.  She had a pacemaker placed by cardiologist on 02/25/2018.  The surgical wound healed well.  Last Saturday started developing chest pain she is which she describes his pain from the throat down to the abdomen.  It was not associated with cough or shortness of breath.  The pain did radiate all over the chest.  She called her son and was brought to the hospital. In the EDShe was noted to have a temperature of 97 heart rate of 101, blood pressure 137/44.  Chest x-ray was normal.  She underwent a CTA which did not reveal any pulmonary embolism.  There was thickening of the distal esophagus suggestive of reflux esophagitis.She had a WBC of 19.4 and lactate of 2.3 which up to went up to 3.5, procalcitonin was less than 0.10.  Troponin was normal.   She was initially started on ceftriaxone and Zithromax and then changed to vancomycin and also got a dose of Zosyn and now she is currently on Zosyn  Subjective Patient has not had any chest pain. She still very tired. Poor appetite No fever or chills No cough   Objective:  VITALS:  BP 117/72 (BP Location: Left Arm)   Pulse 82   Temp 98 F (36.7 C) (Oral)   Resp 18   Ht 5\' 4"  (1.626 m)   Wt 54 kg   SpO2 90%   BMI 20.44 kg/m  PHYSICAL EXAM:  General: Alert, cooperative, no distress, pale  Head: Normocephalic, without obvious abnormality, atraumatic. Eyes: Conjunctivae clear, anicteric sclerae. Pupils are equal ENT Nares normal. No drainage or sinus tenderness. Lips, mucosa, and tongue normal. No Thrush Neck: Supple, symmetrical, no adenopathy, thyroid: non tender no carotid bruit and no  JVD. Back: No CVA tenderness. Lungs: Bilateral air entry.  No crepitation Heart: Regular rate and rhythm, no murmur, rub or gallop. Pacemaker site healed very well.  No erythema, tenderness or swelling or discharge. Abdomen: Soft, non-tender,not distended. Bowel sounds normal. No masses Extremities: atraumatic, no cyanosis. No edema. No clubbing Skin: Skin over the legs very dry with eczematous patches especially on the right ankle on the lateral aspect and left ankle on the lateral aspect.  There is also raised lesion on the right leg which looks like a BCC Lymph: Cervical, supraclavicular normal. Neurologic: Grossly non-focal Pertinent Labs CBC Latest Ref Rng & Units 04/14/2018 04/13/2018 04/12/2018  WBC 3.6 - 11.0 K/uL 15.8(H) 14.9(H) 19.4(H)  Hemoglobin 12.0 - 16.0 g/dL 10.8(L) 10.8(L) 12.5  Hematocrit 35.0 - 47.0 % 32.7(L) 32.9(L) 38.4  Platelets 150 - 440 K/uL 289 269 345   CMP Latest Ref Rng & Units 04/13/2018 04/12/2018 02/19/2018  Glucose 70 - 99 mg/dL 175(H) 169(H) 191(H)  BUN 8 - 23 mg/dL 20 25(H) 41(H)  Creatinine 0.44 - 1.00 mg/dL 0.88 0.84 1.14(H)  Sodium 135 - 145 mmol/L 137 135 139  Potassium 3.5 - 5.1 mmol/L 4.0 4.9 4.6  Chloride 98 - 111 mmol/L 104 96(L) 102  CO2 22 - 32 mmol/L 23 25 26   Calcium 8.9 - 10.3 mg/dL 8.5(L) 9.5 9.6  Total Protein 6.5 - 8.1 g/dL - 6.9 -  Total Bilirubin 0.3 - 1.2  mg/dL - 0.6 -  Alkaline Phos 38 - 126 U/L - 78 -  AST 15 - 41 U/L - 19 -  ALT 0 - 44 U/L - 10 -  UA no leukocytes or nitrites  IMAGING RESULTS: ? Impression/Recommendation ?84 y.o. female with a history of second-degree AV blockunderwent dual-chamber permanent pacemaker placement on 02/25/2018. She is admitted with chest pain.   Chest pain, leukocytosis and increase in lactate. EKG showed paced rhythm Troponin normal Leukocytosis persist.  Is unclear why she has leukocytosis.  At first blush it would appear that because of the recent pacemaker concern for a pacemaker site  infection.  But the site looks very clean and has healed very well.  2D echo shows an EF of 30 to 35%. There is no evidence of UTI or pneumonia. She does have skin eczema but that does not look infected. She is currently on Zosyn.. Blood cultures remain negative so far.  If it remains so tomorrow  we may DC antibiotics. ? ?Second-degree AV block status post pacemaker in August 2019.  Eczema of the legs. needs moisturizer  Thin skin and bruises  on the arms.  A small scratch(from her cat) with the scab present on the right arm but there is no evidence of any infection there.  No axillary adenopathy. ___________________________________________________ Discussed with patient and her son and daughter-in-law.

## 2018-04-15 DIAGNOSIS — M25561 Pain in right knee: Secondary | ICD-10-CM

## 2018-04-15 DIAGNOSIS — R0602 Shortness of breath: Secondary | ICD-10-CM

## 2018-04-15 DIAGNOSIS — Z515 Encounter for palliative care: Secondary | ICD-10-CM

## 2018-04-15 LAB — BASIC METABOLIC PANEL
Anion gap: 10 (ref 5–15)
BUN: 19 mg/dL (ref 8–23)
CHLORIDE: 99 mmol/L (ref 98–111)
CO2: 27 mmol/L (ref 22–32)
Calcium: 8.2 mg/dL — ABNORMAL LOW (ref 8.9–10.3)
Creatinine, Ser: 0.85 mg/dL (ref 0.44–1.00)
GFR calc Af Amer: >60 mL/min (ref >60)
GFR calc non Af Amer: 56 mL/min — ABNORMAL LOW (ref >60)
Glucose, Bld: 143 mg/dL — ABNORMAL HIGH (ref 70–99)
POTASSIUM: 3.3 mmol/L — AB (ref 3.5–5.1)
SODIUM: 136 mmol/L (ref 135–145)

## 2018-04-15 LAB — GLUCOSE, CAPILLARY
Glucose-Capillary: 134 mg/dL — ABNORMAL HIGH (ref 70–99)
Glucose-Capillary: 148 mg/dL — ABNORMAL HIGH (ref 70–99)
Glucose-Capillary: 235 mg/dL — ABNORMAL HIGH (ref 70–99)
Glucose-Capillary: 171 mg/dL — ABNORMAL HIGH (ref 70–99)
Glucose-Capillary: 183 mg/dL — ABNORMAL HIGH (ref 70–99)

## 2018-04-15 LAB — CBC
HEMATOCRIT: 30.9 % — AB (ref 36.0–46.0)
HEMOGLOBIN: 10.5 g/dL — AB (ref 12.0–15.0)
MCH: 32 pg (ref 26.0–34.0)
MCHC: 34 g/dL (ref 30.0–36.0)
MCV: 94.2 fL (ref 80.0–100.0)
Platelets: 313 10*3/uL (ref 150–400)
RBC: 3.28 MIL/uL — AB (ref 3.87–5.11)
RDW: 13 % (ref 11.5–15.5)
WBC: 14.7 10*3/uL — ABNORMAL HIGH (ref 4.0–10.5)
nRBC: 0 % (ref 0.0–0.2)

## 2018-04-15 LAB — MAGNESIUM: MAGNESIUM: 1.7 mg/dL (ref 1.7–2.4)

## 2018-04-15 MED ORDER — MAGNESIUM SULFATE 2 GM/50ML IV SOLN
2.0000 g | Freq: Once | INTRAVENOUS | Status: AC
  Administered 2018-04-15: 2 g via INTRAVENOUS
  Filled 2018-04-15: qty 50

## 2018-04-15 MED ORDER — POTASSIUM CHLORIDE CRYS ER 20 MEQ PO TBCR
40.0000 meq | EXTENDED_RELEASE_TABLET | Freq: Once | ORAL | Status: AC
  Administered 2018-04-15: 40 meq via ORAL
  Filled 2018-04-15: qty 2

## 2018-04-15 MED ORDER — TRAMADOL HCL 50 MG PO TABS
50.0000 mg | ORAL_TABLET | Freq: Once | ORAL | Status: AC
  Administered 2018-04-15: 50 mg via ORAL
  Filled 2018-04-15: qty 1

## 2018-04-15 MED ORDER — ADULT MULTIVITAMIN W/MINERALS CH
1.0000 | ORAL_TABLET | Freq: Every day | ORAL | Status: DC
  Administered 2018-04-16 – 2018-04-20 (×5): 1 via ORAL
  Filled 2018-04-15 (×5): qty 1

## 2018-04-15 MED ORDER — ENSURE ENLIVE PO LIQD
237.0000 mL | Freq: Two times a day (BID) | ORAL | Status: DC
  Administered 2018-04-15 – 2018-04-16 (×2): 237 mL via ORAL

## 2018-04-15 NOTE — Progress Notes (Signed)
PT Cancellation Note  Patient Details Name: Misty Trevino MRN: [id number] DOB: 1921/01/03   Cancelled Treatment:     Pt currently unavailable RN reporting palliative care NP meeting with pt and family. Will re-attempt evaluation at a later time/date when pt is available.    Ernie Avena, SPT 04/15/2018, 1:15 PM

## 2018-04-15 NOTE — Progress Notes (Signed)
? Misty Trevino is a 84 y.o. female with a history of second-degree AV blockunderwent dual-chamber permanent pacemaker placement on 02/25/2018. She is admitted with chest pain. Patient lives on her own.  She has been followed by cardiologist.  She has had fatigue and weakness and was found to have bradycardia due to intermittent type II second-degree AV block.  She had a pacemaker placed by cardiologist on 02/25/2018.  The surgical wound healed well.  Last Saturday started developing chest pain she is which she describes his pain from the throat down to the abdomen.  It was not associated with cough or shortness of breath.  The pain did radiate all over the chest.  She called her son and was brought to the hospital. In the EDShe was noted to have a temperature of 97 heart rate of 101, blood pressure 137/44.  Chest x-ray was normal.  She underwent a CTA which did not reveal any pulmonary embolism.  There was thickening of the distal esophagus suggestive of reflux esophagitis.She had a WBC of 19.4 and lactate of 2.3 which up to went up to 3.5, procalcitonin was less than 0.10.  Troponin was normal.   She was initially started on ceftriaxone and Zithromax and then changed to vancomycin and also got a dose of Zosyn and now she is currently on Zosyn  Subjective Patient has not had any chest pain. Has rt knee pain since last night No fever  Objective:  VITALS:  BP 129/61 (BP Location: Left Arm)   Pulse 96   Temp 97.8 F (36.6 C) (Oral)   Resp 18   Ht 5\' 4"  (1.626 m)   Wt 52 kg   SpO2 91%   BMI 19.69 kg/m  PHYSICAL EXAM:  General: Alert, cooperative, no distress, pale  Head: Normocephalic, without obvious abnormality, atraumatic. Eyes: Conjunctivae clear, anicteric sclerae. Pupils are equal ENT Nares normal. No drainage or sinus tenderness. Lips, mucosa, and tongue normal. No Thrush Neck: Supple, symmetrical, no adenopathy, thyroid: non tender no carotid bruit and no JVD. Back: No CVA  tenderness. Lungs: Bilateral air entry.  Few basal crepts Heart: Regular rate and rhythm, no murmur, rub or gallop. Pacemaker site healed very well.  No erythema, tenderness or swelling or discharge. Abdomen: Soft, non-tender,not distended. Bowel sounds normal. No masses Extremities: rt knee minimal swelling- no erythema   Skin: Skin over the legs very dry with eczematous patches especially on the right ankle on the lateral aspect and left ankle on the lateral aspect.  There is also raised lesion on the right leg which looks like a BCC Lymph: Cervical, supraclavicular normal. Neurologic: Grossly non-focal Pertinent Labs CBC Latest Ref Rng & Units 04/15/2018 04/14/2018 04/13/2018  WBC 4.0 - 10.5 K/uL 14.7(H) 15.8(H) 14.9(H)  Hemoglobin 12.0 - 15.0 g/dL 10.5(L) 10.8(L) 10.8(L)  Hematocrit 36.0 - 46.0 % 30.9(L) 32.7(L) 32.9(L)  Platelets 150 - 400 K/uL 313 289 269   CMP Latest Ref Rng & Units 04/15/2018 04/13/2018 04/12/2018  Glucose 70 - 99 mg/dL 143(H) 175(H) 169(H)  BUN 8 - 23 mg/dL 19 20 25(H)  Creatinine 0.44 - 1.00 mg/dL 0.85 0.88 0.84  Sodium 135 - 145 mmol/L 136 137 135  Potassium 3.5 - 5.1 mmol/L 3.3(L) 4.0 4.9  Chloride 98 - 111 mmol/L 99 104 96(L)  CO2 22 - 32 mmol/L 27 23 25   Calcium 8.9 - 10.3 mg/dL 8.2(L) 8.5(L) 9.5  Total Protein 6.5 - 8.1 g/dL - - 6.9  Total Bilirubin 0.3 - 1.2 mg/dL - -  0.6  Alkaline Phos 38 - 126 U/L - - 78  AST 15 - 41 U/L - - 19  ALT 0 - 44 U/L - - 10  UA no leukocytes or nitrites  IMAGING RESULTS: ? Impression/Recommendation ?84 y.o. female with a history of second-degree AV blockunderwent dual-chamber permanent pacemaker placement on 02/25/2018. She is admitted with chest pain.   Chest pain, leukocytosis and increase in lactate. EKG showed paced rhythm Troponin normal Leukocytosis better   As she has rt knee pain/some swelling  r/o gout or CPPD as cause of the leucocytosis At first blush it would appear that because of the recent pacemaker  concern for a pacemaker site infection.  But the site looks very clean and has healed very well.  2D echo shows an EF of 30 to 35%. There is no evidence of UTI or pneumonia. She does have skin eczema but that does not look infected.  Blood cultures remain negative so far.  Okay to DC zosyn and observe ? ?Second-degree AV block status post pacemaker in August 2019.  Eczema of the legs. needs moisturizer  Thin skin and bruises  on the arms.  A small scratch(from her cat) with the scab present on the right arm but there is no evidence of any infection there.  No axillary adenopathy. ___________________________________________________ Discussed with patient and her  daughter-and Dr.chen ID will sign off- call if needed.

## 2018-04-15 NOTE — Evaluation (Signed)
Physical Therapy Evaluation Patient Details Name: Misty Trevino MRN: [id number] DOB: 28-May-1921 Today's Date: 04/15/2018   History of Present Illness  Pt is a 84 y.o. female presenting to hospital 04/12/18 with generalized chest pain with radiation to L arm and neck, weakness, and SOB.  Pt admitted with chest pain, sepsis, leukocytosis, and acute respiratory failure with hypoxia d/t acute CHF.  PMH includes heart block s/p pacemaker 02/25/18, DM, dyspnea, edema, dysrhythmia, htn, R shoulder arthroscopy x2, stroke, SSS, h/o orthopnea, back surgery, eye surgery.  Pt also with thin skin and bruises easily.  Clinical Impression  Prior to hospital admission, pt was ambulatory with 4ww.  Pt lives with her grandson (who works during the day) in 1 level home with 2 steps with R grab bar to enter.  Currently pt is 2 assist with bed mobility and to stand (x2 trials) with RW.  Pt reporting having pain earlier today in R LE but no pain beginning of session (d/t having pain medications).  R knee painful (medial, lateral, and posterior knee regions) with knee movement,  R LE WB'ing, and to touch; 6-7/10 R knee pain with standing; 3-4/10 R knee pain end of session resting in bed (pt declined pain medications); pt and pt's daughter report no recent falls or possible known cause of pain.  R LE also appearing weaker compared to L LE.  At beginning of pt's hospital stay, pt and pt's daughter reporting pt had been getting up to [facility] with assist but pt currently unable to ambulate with 2 assist and use of RW (and using purewick in bed).  Pt would benefit from skilled PT to address noted impairments and functional limitations (see below for any additional details).  Upon hospital discharge, recommend pt discharge to [place].    Follow Up Recommendations SNF    Equipment Recommendations  Rolling walker with 5" wheels;3in1 (PT);Wheelchair (measurements PT);Wheelchair cushion (measurements PT)    Recommendations for Other  Services OT consult     Precautions / Restrictions Precautions Precautions: Fall Restrictions Weight Bearing Restrictions: No      Mobility  Bed Mobility Overal bed mobility: Needs Assistance Bed Mobility: Supine to Sit;Sit to Supine     Supine to sit: Min assist;Mod assist;+2 for physical assistance;HOB elevated Sit to supine: Min assist;Mod assist;+2 for physical assistance   General bed mobility comments: assist for R>L LE; assist for trunk; vc's for technique and to use bed rail; 2 assist to boost pt up in bed end of session  Transfers Overall transfer level: Needs assistance Equipment used: Rolling walker (2 wheeled) Transfers: Sit to/from Stand Sit to Stand: Mod assist;+2 physical assistance         General transfer comment: R knee blocked; x2 trials; assist to initiate and come to full stand; vc's for upright posture and UE/LE positioning for transfers required  Ambulation/Gait Ambulation/Gait assistance: Min assist;Mod assist;+2 physical assistance   Assistive device: Rolling walker (2 wheeled)   Gait velocity: decreased   General Gait Details: pt able to slide R foot backwards on floor (unable to clear from floor); unable to take any step with L LE; use of RW  Stairs            Wheelchair Mobility    Modified Rankin (Stroke Patients Only)       Balance Overall balance assessment: Needs assistance Sitting-balance support: Feet supported;Bilateral upper extremity supported Sitting balance-Leahy Scale: Poor Sitting balance - Comments: pt with initial posterior lean (mod assist for sitting balance) but with  repositioning and vc's for shifting weight forward pt close SBA for safety   Standing balance support: Bilateral upper extremity supported Standing balance-Leahy Scale: Poor Standing balance comment: posterior lean noted in standing requiring cueing to shift weight forward; use of RW                             Pertinent  Vitals/Pain Pain Assessment: 0-10 Pain Score: 4  Pain Location: R knee (medial, lateral, and posterior knee pain); tender to touch Pain Descriptors / Indicators: Sore;Tender;Guarding;Grimacing;Discomfort Pain Intervention(s): Limited activity within patient's tolerance;Monitored during session;Premedicated before session;Repositioned;Other (comment)(Pt declined pain medication)  HR WFL during session; O2 sats 89% or greater on 3.5-4L/min O2 via nasal cannula during session (91% on 3.5 L O2 end of session resting in bed).    Home Living Family/patient expects to be discharged to:: Private residence Living Arrangements: Other relatives(grandson) Available Help at Discharge: Family Type of Home: House Home Access: Stairs to enter   [employer] of Steps: 2 steps to enter with grab bar on [place]: One level [place]: Grab bars - toilet;Walker - 4 wheels      Prior Function Level of Independence: Needs assistance   Gait / Transfers Assistance Needed: Ambulatory with 4ww; h/o fall about 1 year ago  ADL's / Homemaking Assistance Needed: Grandson (who works during the day) assists with vaccuuming, trash, household tasks as needed        Journalist, newspaper        Extremity/Trunk Assessment   Upper Extremity Assessment Upper Extremity Assessment: (fair B hand grip strength; B shoulder flexion AROM grossly 60-70 degrees (baseline); at least 3/5 AROM elbow flexion/extension)    Lower Extremity Assessment Lower Extremity Assessment: RLE deficits/detail;LLE deficits/detail RLE Deficits / Details: hip flexion 2/5; knee flexion/extension limited d/t knee pain (at least 2/5 AROM); DF/PF at least 3/5 AROM RLE: Unable to fully assess due to pain LLE Deficits / Details: hip flexion 3/5 AROM; knee flexion/extension at least 3/5 AROM; DF/PF at least 3/5 AROM    Cervical / Trunk Assessment Cervical / Trunk Assessment: Kyphotic  Communication   Communication: No difficulties   Cognition Arousal/Alertness: Awake/alert Behavior During Therapy: WFL for tasks assessed/performed Overall Cognitive Status: Within Functional Limits for tasks assessed                                        General Comments   Nursing cleared pt for participation in physical therapy.  Pt agreeable to PT session.  Pt's daughter present during session.    Exercises     Assessment/Plan    PT Assessment Patient needs continued PT services  PT Problem List Decreased strength;Decreased range of motion;Decreased activity tolerance;Decreased balance;Decreased mobility;Decreased knowledge of use of DME;Decreased knowledge of precautions;Pain       PT Treatment Interventions DME instruction;Gait training;Stair training;Functional mobility training;Therapeutic activities;Therapeutic exercise;Balance training;Patient/family education    PT Goals (Current goals can be found in the Care Plan section)  Acute Rehab PT Goals Patient Stated Goal: to get stronger and improve mobility PT Goal Formulation: With patient/family Time For Goal Achievement: 04/29/18 Potential to Achieve Goals: Good    Frequency Min 2X/week   Barriers to discharge Decreased caregiver support      Co-evaluation               AM-PAC PT "6 Clicks" Daily  Activity  Outcome Measure Difficulty turning over in bed (including adjusting bedclothes, sheets and blankets)?: Unable Difficulty moving from lying on back to sitting on the side of the bed? : Unable Difficulty sitting down on and standing up from a chair with arms (e.g., wheelchair, bedside commode, etc,.)?: Unable Help needed moving to and from a bed to chair (including a wheelchair)?: Total Help needed walking in hospital room?: Total Help needed climbing 3-5 steps with a railing? : Total 6 Click Score: 6    End of Session Equipment Utilized During Treatment: Gait belt;Oxygen Activity Tolerance: Patient limited by fatigue;Patient limited  by pain Patient left: in bed;with call bell/phone within reach;with bed alarm set;with nursing/sitter in room;with family/visitor present;Other (comment)(B heels elevated via pillow; NT present to change pt's brief's and to replace purewick) Nurse Communication: Mobility status;Precautions PT Visit Diagnosis: Other abnormalities of gait and mobility (R26.89);Muscle weakness (generalized) (M62.81);History of falling (Z91.81);Difficulty in walking, not elsewhere classified (R26.2);Pain Pain - Right/Left: Right Pain - part of body: Knee    Time: 1440-1525 PT Time Calculation (min) (ACUTE ONLY): 45 min   Charges:   PT Evaluation $PT Eval Low Complexity: 1 Low PT Treatments $Therapeutic Activity: 23-37 mins       Leitha Bleak, PT 04/15/18, 4:18 PM [phone]

## 2018-04-15 NOTE — Progress Notes (Signed)
[facility] [place] NAME: Misty Trevino    MR#:  [id number]  DATE OF BIRTH:  1921-06-09  SUBJECTIVE:  CHIEF COMPLAINT:   Chief Complaint  Patient presents with  . Chest Pain   The patient has better cough and SOB , on oxygen by nasal cannula 3.5 L.  She has poor oral intake per RN. REVIEW OF SYSTEMS:  Review of Systems  Constitutional: Negative for chills, fever and malaise/fatigue.  HENT: Negative for sore throat.   Eyes: Negative for blurred vision and double vision.  Respiratory: Negative for cough, hemoptysis, shortness of breath, wheezing and stridor.   Cardiovascular: Negative for chest pain, palpitations, orthopnea and leg swelling.  Gastrointestinal: Negative for abdominal pain, blood in stool, diarrhea, melena, nausea and vomiting.  Genitourinary: Negative for dysuria, flank pain and hematuria.  Musculoskeletal: Negative for back pain and joint pain.  Skin: Negative for rash.  Neurological: Negative for dizziness, sensory change, focal weakness, seizures, loss of consciousness, weakness and headaches.  Endo/Heme/Allergies: Negative for polydipsia.  Psychiatric/Behavioral: Negative for depression. The patient is not nervous/anxious.     DRUG ALLERGIES:  No Known Allergies VITALS:  Blood pressure 129/61, pulse 96, temperature 97.8 F (36.6 C), temperature source Oral, resp. rate 18, height 5\' 4"  (1.626 m), weight 52 kg, SpO2 91 %. PHYSICAL EXAMINATION:  Physical Exam  Constitutional: She is oriented to person, place, and time. No distress.  Malnutrition.  HENT:  Head: Normocephalic.  Mouth/Throat: Oropharynx is clear and moist.  Eyes: Pupils are equal, round, and reactive to light. Conjunctivae and EOM are normal. No scleral icterus.  Neck: Normal range of motion. Neck supple. No JVD present. No tracheal deviation present.  Cardiovascular: Normal rate, regular rhythm and normal heart sounds. Exam reveals no gallop.    No murmur heard. Pulmonary/Chest: Effort normal. No stridor. No respiratory distress. She has no wheezes. She has no rales.  Bilateral base crackles.  Abdominal: Soft. Bowel sounds are normal. She exhibits no distension. There is no tenderness. There is no rebound.  Musculoskeletal: Normal range of motion. She exhibits no edema or tenderness.  Neurological: She is alert and oriented to person, place, and time. No cranial nerve deficit.  Skin: No rash noted. No erythema.  Psychiatric: She has a normal mood and affect.   LABORATORY PANEL:  Female CBC Recent Labs  Lab 04/15/18 0523  WBC 14.7*  HGB 10.5*  HCT 30.9*  PLT 313   ------------------------------------------------------------------------------------------------------------------ Chemistries  Recent Labs  Lab 04/12/18 0048  04/15/18 0523  NA 135   < > 136  K 4.9   < > 3.3*  CL 96*   < > 99  CO2 25   < > 27  GLUCOSE 169*   < > 143*  BUN 25*   < > 19  CREATININE 0.84   < > 0.85  CALCIUM 9.5   < > 8.2*  MG  --   --  1.7  AST 19  --   --   ALT 10  --   --   ALKPHOS 78  --   --   BILITOT 0.6  --   --    < > = values in this interval not displayed.   RADIOLOGY:  No results found. ASSESSMENT AND PLAN:   84 year old female admitted for chest pain.  1. Chest pain:  Improved. Normal cardiac biomarkers. Recommended conservative medical therapy per Dr. Clayborn Bigness.  2. Sepsis:Source of infection is unclear at this  time. Follow CBC and blood cultures for growth and sensitivity.   Continue Zosyn.  Discontinue vancomycin due to negative MRSA screen. Zithromax and Rocephin were discontinued. Per Dr. Steva Ready, follow-up blood culture, may discontinue Zosyn if negative. Discontinue Zosyn.  Lactic acidosis due to above.    Improved.  3. Arrhythmia: Continue Eliquis 4. Diabetes mellitus type 2: Hold metformin for now. Sliding scale insulin while hospitalized.  5. Gout: Continue allopurinol.   Acute  respiratory failure with hypoxia due to acute systolic CHF, LV EF: 81% -   40%. Continue oxygen by nasal cannula, DuoNeb as needed.  Chest x-ray show CHF. Started CHF protocol, Lasix 20 mg IV twice daily, echocardiograph: LV EF: 35% -   40%.  Hold Lasix if blood pressure is low.  Severe malnutrition.  Follow dietitian.  Very poor prognosis.  The patient may need hospice care at home. I discussed with Dr. Steva Ready and palliative care nurse practitioner Crystal. All the records are reviewed and case discussed with Care Management/Social Worker. Management plans discussed with the patient, her 2 daughters and they are in agreement.  CODE STATUS: DNR  TOTAL TIME TAKING CARE OF THIS PATIENT: 42 minutes.   More than 50% of the time was spent in counseling/coordination of care: YES  POSSIBLE D/C IN 2-3 DAYS, DEPENDING ON CLINICAL CONDITION.   Demetrios Loll M.D on 04/15/2018 at 6:04 PM  Between 7am to 6pm - Pager - [phone]  After 6pm go to [email or web address] - [facility]

## 2018-04-15 NOTE — Consult Note (Signed)
Consultation Note Date: 04/15/2018   Patient Name: Misty Trevino  DOB: July 01, 1921  MRN: [id number]  Age / Sex: 84 y.o., female  PCP: Albina Billet, MD Referring Physician: Demetrios Loll, MD  Reason for Consultation: Establishing goals of care  HPI/Patient Profile: Patient with past medical history of sick sinus syndrome status post pacemaker placement 6 weeks ago, diabetes and hypertension presents to the emergency department complaining of chest pain.  The patient's pain is central but radiates across both sides of her chest.  Clinical Assessment and Goals of Care: Patient is resting in bed with multiple family members at bedside. She is widowed. She has 3 children. One of her grandsons live with her. He helps her with cleaning and errands. He leaves on the weekends and other family members help.   Family states since her pacemaker was placed 6 weeks ago she has not had the energy increase they were expecting. She has not been napping as much as she used to. She takes sink bathes or goes to her daughter's to take a shower. She feels tired after bathing and sometimes cannot bathe her entire body. She states she has been working on eating more. Here at the hospital, she has had early satiety and has not eaten well.   She has been weak and uses a walker and cane. She stopped driving 2 years ago. She was a Psychologist, occupational until 2 years ago.    Daughter wanting her to eat lunch. Will return tomorrow to continue conversation.     SUMMARY OF RECOMMENDATIONS   [place] building. Will return tomorrow for [place] conversations.  Dietician consult placed.   Code Status/Advance Care Planning:  DNR    Symptom Management:   Per primary team.   Prognosis:   Unable to determine  Discharge Planning: To Be Determined      Primary Diagnoses: Present on Admission: . Chest pain . Sepsis ([place])   I have reviewed the  medical record, interviewed the patient and family, and examined the patient. The following aspects are pertinent.  Past Medical History:  Diagnosis Date  . Diabetes mellitus without complication ([place])   . Dyspnea    chronic doe  . Dysrhythmia    afib / block  . Edema    pedal  . GERD (gastroesophageal reflux disease)   . History of orthopnea   . Hypertension   . Sick sinus syndrome ([place])   . Stroke [facility])    tia 2 years ago   Social History   Socioeconomic History  . Marital status: Widowed    Spouse name: Not on file  . Number of children: Not on file  . Years of education: Not on file  . Highest education level: Not on file  Occupational History  . Not on file  Social Needs  . Financial resource strain: Not on file  . Food insecurity:    Worry: Not on file    Inability: Not on file  . Transportation needs:    Medical: Not on file  Non-medical: Not on file  Tobacco Use  . Smoking status: Never Smoker  . Smokeless tobacco: Never Used  Substance and Sexual Activity  . Alcohol use: Never    Frequency: Never  . Drug use: Never  . Sexual activity: Not on file  Lifestyle  . Physical activity:    Days per week: Not on file    Minutes per session: Not on file  . Stress: Not on file  Relationships  . Social connections:    Talks on phone: Not on file    Gets together: Not on file    Attends religious service: Not on file    Active member of club or organization: Not on file    Attends meetings of clubs or organizations: Not on file    Relationship status: Not on file  Other Topics Concern  . Not on file  Social History Narrative  . Not on file   No family history on file. Scheduled Meds: . allopurinol  100 mg Oral QPM  . apixaban  2.5 mg Oral BID  . carvedilol  3.125 mg Oral BID WC  . cholecalciferol  1,000 Units Oral Daily  . docusate sodium  100 mg Oral BID  . furosemide  20 mg Intravenous Q12H  . insulin aspart  0-5 Units Subcutaneous QHS  . insulin  aspart  0-9 Units Subcutaneous TID WC  . lisinopril  2.5 mg Oral Daily  . mouth rinse  15 mL Mouth Rinse BID  . nitroGLYCERIN  0.5 inch Topical Q6H  . polyvinyl alcohol  1 drop Both Eyes QPM  . triamcinolone cream  1 application Topical QHS   Continuous Infusions: . sodium chloride Stopped (04/13/18 0642)  . magnesium sulfate 1 - 4 g bolus IVPB     PRN Meds:.sodium chloride, acetaminophen **OR** acetaminophen, diphenhydrAMINE, guaiFENesin-dextromethorphan, ondansetron **OR** ondansetron (ZOFRAN) IV Medications Prior to Admission:  Prior to Admission medications   Medication Sig Start Date End Date Taking? Authorizing Provider  allopurinol (ZYLOPRIM) 100 MG tablet Take 100 mg by mouth every evening.   Yes [provider]  apixaban (ELIQUIS) 2.5 MG TABS tablet Take 2.5 mg by mouth 2 (two) times daily.   Yes [provider]  carboxymethylcellulose (REFRESH PLUS) 0.5 % SOLN Place 1 drop into both eyes every evening.   Yes [provider]  cholecalciferol (VITAMIN D) 1000 units tablet Take 1,000 Units by mouth daily.   Yes [provider]  metFORMIN (GLUCOPHAGE) 850 MG tablet Take 850 mg by mouth daily with breakfast.   Yes [provider]  Multiple Vitamins-Minerals (PRESERVISION AREDS 2 PO) Take 1 capsule by mouth 2 (two) times daily.   Yes [provider]  triamcinolone cream (KENALOG) 0.1 % Apply 1 application topically at bedtime.   Yes [provider]   No Known Allergies Review of Systems  Neurological: Positive for weakness.    Physical Exam  Constitutional: No distress.  Pulmonary/Chest: Effort normal.  Neurological: She is alert.  Oriented    Vital Signs: BP (!) 104/46 (BP Location: Right Arm)   Pulse 60   Temp 97.8 F (36.6 C) (Oral)   Resp 16   Ht 5\' 4"  (1.626 m)   Wt 52 kg   SpO2 92%   BMI 19.69 kg/m  Pain Scale: 0-10   Pain Score: 0-No pain   SpO2: SpO2: 92 % O2 Device:SpO2: 92 % O2 Flow Rate:  .O2 Flow Rate (L/min): 3.5 L/min  IO: Intake/output summary:   Intake/Output Summary (Last  24 hours) at 04/15/2018 1333 Last data filed at 04/15/2018 0900 Gross per 24 hour  Intake 347.36 ml  Output 1600 ml  Net -1252.64 ml    LBM: Last BM Date: 04/11/18 Baseline Weight: Weight: 50 kg Most recent weight: Weight: 52 kg     Palliative Assessment/Data:      Time In: 12:40 Time Out: 1:30 Time Total: 50 min Greater than 50%  of this time was spent counseling and coordinating care related to the above assessment and plan.  Signed by: Asencion Gowda, NP   Please contact Palliative Medicine Team phone at [phone] for questions and concerns.  For individual provider: See Shea Evans

## 2018-04-15 NOTE — Care Management Important Message (Signed)
Copy of signed IM left with patient in room.  

## 2018-04-16 ENCOUNTER — Inpatient Hospital Stay: Payer: Medicare Other

## 2018-04-16 LAB — CBC
HEMATOCRIT: 32.9 % — AB (ref 36.0–46.0)
Hemoglobin: 11 g/dL — ABNORMAL LOW (ref 12.0–15.0)
MCH: 31.9 pg (ref 26.0–34.0)
MCHC: 33.4 g/dL (ref 30.0–36.0)
MCV: 95.4 fL (ref 80.0–100.0)
Platelets: 355 10*3/uL (ref 150–400)
RBC: 3.45 MIL/uL — ABNORMAL LOW (ref 3.87–5.11)
RDW: 13 % (ref 11.5–15.5)
WBC: 15.4 10*3/uL — ABNORMAL HIGH (ref 4.0–10.5)
nRBC: 0 % (ref 0.0–0.2)

## 2018-04-16 LAB — BASIC METABOLIC PANEL
Anion gap: 9 (ref 5–15)
BUN: 21 mg/dL (ref 8–23)
CHLORIDE: 100 mmol/L (ref 98–111)
CO2: 28 mmol/L (ref 22–32)
CREATININE: 0.76 mg/dL (ref 0.44–1.00)
Calcium: 8.3 mg/dL — ABNORMAL LOW (ref 8.9–10.3)
GFR calc Af Amer: >60 mL/min (ref >60)
GFR calc non Af Amer: >60 mL/min (ref >60)
Glucose, Bld: 178 mg/dL — ABNORMAL HIGH (ref 70–99)
Potassium: 3.8 mmol/L (ref 3.5–5.1)
Sodium: 137 mmol/L (ref 135–145)

## 2018-04-16 LAB — GLUCOSE, CAPILLARY
Glucose-Capillary: 155 mg/dL — ABNORMAL HIGH (ref 70–99)
Glucose-Capillary: 340 mg/dL — ABNORMAL HIGH (ref 70–99)
Glucose-Capillary: 212 mg/dL — ABNORMAL HIGH (ref 70–99)
Glucose-Capillary: 150 mg/dL — ABNORMAL HIGH (ref 70–99)

## 2018-04-16 LAB — MAGNESIUM: Magnesium: 1.8 mg/dL (ref 1.7–2.4)

## 2018-04-16 MED ORDER — GLUCERNA SHAKE PO LIQD
237.0000 mL | Freq: Three times a day (TID) | ORAL | Status: DC
  Administered 2018-04-16 – 2018-04-20 (×8): 237 mL via ORAL

## 2018-04-16 MED ORDER — SODIUM CHLORIDE 0.9% FLUSH
3.0000 mL | Freq: Two times a day (BID) | INTRAVENOUS | Status: DC
  Administered 2018-04-16 – 2018-04-20 (×7): 3 mL via INTRAVENOUS

## 2018-04-16 MED ORDER — VITAMIN C 500 MG PO TABS
250.0000 mg | ORAL_TABLET | Freq: Two times a day (BID) | ORAL | Status: DC
  Administered 2018-04-16 – 2018-04-20 (×9): 250 mg via ORAL
  Filled 2018-04-16 (×10): qty 1

## 2018-04-16 NOTE — Progress Notes (Signed)
OT Cancellation Note  Patient Details Name: Misty Trevino MRN: [id number] DOB: 1920-11-08   Cancelled Treatment:    Reason Eval/Treat Not Completed: Other (comment). Order received, chart reviewed. Due to high volume of OT consults, unable to evaluate today. Will plan to see next date.   Jeni Salles, MPH, MS, OTR/L ascom [phone] 04/16/18, 12:40 PM

## 2018-04-16 NOTE — Plan of Care (Signed)
  Problem: Education: Goal: Knowledge of General Education information will improve Description Including pain rating scale, medication(s)/side effects and non-pharmacologic comfort measures Outcome: Progressing   Problem: Health Behavior/Discharge Planning: Goal: Ability to manage health-related needs will improve Outcome: Progressing   Problem: Clinical Measurements: Goal: Ability to maintain clinical measurements within normal limits will improve Outcome: Progressing Goal: Will remain free from infection Outcome: Progressing Goal: Diagnostic test results will improve Outcome: Progressing Goal: Respiratory complications will improve Outcome: Progressing Goal: Cardiovascular complication will be avoided Outcome: Progressing   Problem: Activity: Goal: Risk for activity intolerance will decrease Outcome: Progressing   Problem: Nutrition: Goal: Adequate nutrition will be maintained Outcome: Progressing   Problem: Coping: Goal: Level of anxiety will decrease Outcome: Progressing   Problem: Elimination: Goal: Will not experience complications related to bowel motility Outcome: Progressing Goal: Will not experience complications related to urinary retention Outcome: Progressing   Problem: Pain Managment: Goal: General experience of comfort will improve Outcome: Progressing   Problem: Safety: Goal: Ability to remain free from injury will improve Outcome: Progressing   Problem: Skin Integrity: Goal: Risk for impaired skin integrity will decrease Outcome: Progressing   Problem: Education: Goal: Understanding of cardiac disease, CV risk reduction, and recovery process will improve Outcome: Progressing Goal: Individualized Educational Video(s) Outcome: Progressing   Problem: Activity: Goal: Ability to tolerate increased activity will improve Outcome: Progressing   Problem: Cardiac: Goal: Ability to achieve and maintain adequate cardiovascular perfusion will  improve Outcome: Progressing   Problem: Health Behavior/Discharge Planning: Goal: Ability to safely manage health-related needs after discharge will improve Outcome: Progressing   Problem: Fluid Volume: Goal: Hemodynamic stability will improve Outcome: Progressing   Problem: Clinical Measurements: Goal: Diagnostic test results will improve Outcome: Progressing Goal: Signs and symptoms of infection will decrease Outcome: Progressing   Problem: Respiratory: Goal: Ability to maintain adequate ventilation will improve Outcome: Progressing   Problem: Spiritual Needs Goal: Ability to function at adequate level Outcome: Progressing

## 2018-04-16 NOTE — Plan of Care (Signed)
  Problem: Education: Goal: Knowledge of General Education information will improve Description Including pain rating scale, medication(s)/side effects and non-pharmacologic comfort measures Outcome: Progressing   Problem: Pain Managment: Goal: General experience of comfort will improve Outcome: Progressing   Problem: Safety: Goal: Ability to remain free from injury will improve Outcome: Progressing   Problem: Education: Goal: Understanding of cardiac disease, CV risk reduction, and recovery process will improve Outcome: Progressing

## 2018-04-16 NOTE — Progress Notes (Signed)
Initial Nutrition Assessment  DOCUMENTATION CODES:   Severe malnutrition in context of chronic illness  INTERVENTION:   Glucerna Shake po TID, each supplement provides 220 kcal and 10 grams of protein  MVI daily  Vitamin C 250mg  po BID  Magic cup TID with meals, each supplement provides 290 kcal and 9 grams of protein  Liberalize diet  NUTRITION DIAGNOSIS:   Severe Malnutrition related to chronic illness(heart disease, advanced age ) as evidenced by severe fat depletion, severe muscle depletion.  GOAL:   Patient will meet greater than or equal to 90% of their needs  MONITOR:   PO intake, Supplement acceptance, Labs, Weight trends, Skin, I & O's  REASON FOR ASSESSMENT:   Consult Assessment of nutrition requirement/status  ASSESSMENT:   84 y.o. female with a history of second-degree AV blockunderwent dual-chamber permanent pacemaker placement on 02/25/2018 admitted for chest pain    Visited pt's room today. Pt sleeping at time of RD visit so history obtained from pt's daughter at beside. Per daughter, pt with poor appetite and oral intake at baseline; eats only a few bites of her meals. Pt does drink Glucerna daily at home but daughter is unsure how many. Pt does not have any trouble chewing or swallowing per daughter. Pt ate 1/2 muffin, a few bites of potatoes, grits and cereal for breakfast this morning. Pt did drink an Ensure today but prefers the Glucerna. Per chart, pt is weight stable. RD will order supplements and vitamins to help pt meet her estimated needs.   Medications reviewed and include: allopurinol, vitamin D, colace, lasix, insulin, MVI  Labs reviewed: K 3.8 wnl, Mg 1.8 wnl Wbc- 15.4(H) cbgs- 148, 235, 171, 183, 155 x 24hrs  AIC 6.9(H)- 10/6  NUTRITION - FOCUSED PHYSICAL EXAM:    Most Recent Value  Orbital Region  Severe depletion  Upper Arm Region  Severe depletion  Thoracic and Lumbar Region  Severe depletion  Buccal Region  Moderate depletion   Temple Region  Moderate depletion  Clavicle Bone Region  Severe depletion  Clavicle and Acromion Bone Region  Severe depletion  Scapular Bone Region  Moderate depletion  Dorsal Hand  Severe depletion  Patellar Region  Severe depletion  Anterior Thigh Region  Severe depletion  Posterior Calf Region  Severe depletion  Edema (RD Assessment)  None  Hair  Reviewed  Eyes  Reviewed  Mouth  Reviewed  Skin  Reviewed  Nails  Reviewed     Diet Order:   Diet Order            Diet regular Room service appropriate? Yes; Fluid consistency: Thin  Diet effective now             EDUCATION NEEDS:   Education needs have been addressed(with pt's daughter )  Skin:  Skin Assessment: Reviewed RN Assessment  Last BM:  10/10- type 6  Height:   Ht Readings from Last 1 Encounters:  04/12/18 5\' 4"  (1.626 m)    Weight:   Wt Readings from Last 1 Encounters:  04/16/18 53.6 kg    Ideal Body Weight:  54.5 kg  BMI:  Body mass index is 20.28 kg/m.  Estimated Nutritional Needs:   Kcal:  1200-1400kcal/day   Protein:  70-80g/day   Fluid:  >1.2L/day   Koleen Distance MS, RD, LDN Pager #- [phone] Office#- [phone] After Hours Pager: [phone]

## 2018-04-16 NOTE — Plan of Care (Addendum)
PMT note:  To bedside to follow up with patient today. Visitor noted in room speaking with patient and family. Discussed returning tomorrow morning around 10:00. If patient discharges before meeting, recommend outpatient palliative care consult.

## 2018-04-16 NOTE — Progress Notes (Signed)
Physical Therapy Treatment Patient Details Name: Misty Trevino MRN: [id number] DOB: [birth year] Today's Date: 04/16/2018    History of Present Illness Pt is a 84 y.o. female presenting to hospital 04/12/18 with generalized chest pain with radiation to L arm and neck, weakness, and SOB.  Pt admitted with chest pain, sepsis, leukocytosis, and acute respiratory failure with hypoxia d/t acute CHF.  PMH includes heart block s/p pacemaker 02/25/18, DM, dyspnea, edema, dysrhythmia, htn, R shoulder arthroscopy x2, stroke, SSS, h/o orthopnea, back surgery, eye surgery.  Pt also with thin skin and bruises easily.    PT Comments    Pt showed good effort with PT session despite some initial hesitation feeling tired.  She ultimately did well, with increased tolerance and decreased assist needed per previous PT session.  She is still very weak and functionally limited and PT spent time educating pt and family about expected recovery and how she would have a very hard time and be unsafe at home given her current weakness and limitations.  Pt eager to work with PT as much as she is able, family present and part of this conversation.    Follow Up Recommendations  SNF     Equipment Recommendations  Rolling walker with 5" wheels;3in1 (PT);Wheelchair (measurements PT);Wheelchair cushion (measurements PT)    Recommendations for Other Services       Precautions / Restrictions Precautions Precautions: Fall Restrictions Weight Bearing Restrictions: No    Mobility  Bed Mobility Overal bed mobility: (not tested, in recliner)                Transfers Overall transfer level: Needs assistance Equipment used: Rolling walker (2 wheeled) Transfers: Sit to/from Stand Sit to Stand: Mod assist;Min assist         General transfer comment: Pt needed some assist getting to standing, but not nearly the amount as previous PT visit.  Able to maintain balance in standing with  walker  Ambulation/Gait Ambulation/Gait assistance: Mod assist Gait Distance (Feet): 3 Feet Assistive device: Rolling walker (2 wheeled)       General Gait Details: Pt was able to take ~3 steps forward and back with each LE, struggled with R knee WBing tolerance but no buckling, etc   Stairs             Wheelchair Mobility    Modified Rankin (Stroke Patients Only)       Balance Overall balance assessment: Needs assistance   Sitting balance-Leahy Scale: Fair     Standing balance support: Bilateral upper extremity supported Standing balance-Leahy Scale: Fair                              Cognition Arousal/Alertness: Awake/alert Behavior During Therapy: WFL for tasks assessed/performed Overall Cognitive Status: Within Functional Limits for tasks assessed                                        Exercises General Exercises - Lower Extremity Ankle Circles/Pumps: AROM;10 reps Long Arc Quad: AAROM;Strengthening;10 reps Heel Slides: Strengthening;10 reps Hip ABduction/ADduction: AROM;10 reps Hip Flexion/Marching: AAROM;AROM;10 reps(no AROM on R, needed direct assist)    General Comments        Pertinent Vitals/Pain Pain Assessment: No/denies pain    Home Living  Prior Function            PT Goals (current goals can now be found in the care plan section) Progress towards PT goals: Progressing toward goals    Frequency    Min 2X/week      PT Plan Current plan remains appropriate    Co-evaluation              AM-PAC PT "6 Clicks" Daily Activity  Outcome Measure  Difficulty turning over in bed (including adjusting bedclothes, sheets and blankets)?: Unable Difficulty moving from lying on back to sitting on the side of the bed? : Unable Difficulty sitting down on and standing up from a chair with arms (e.g., wheelchair, bedside commode, etc,.)?: Unable Help needed moving to and from a  bed to chair (including a wheelchair)?: Total Help needed walking in hospital room?: Total Help needed climbing 3-5 steps with a railing? : Total 6 Click Score: 6    End of Session Equipment Utilized During Treatment: Gait belt;Oxygen Activity Tolerance: Patient limited by fatigue;Patient limited by pain Patient left: in chair;with family/visitor present;with call bell/phone within reach Nurse Communication: Mobility status PT Visit Diagnosis: Other abnormalities of gait and mobility (R26.89);Muscle weakness (generalized) (M62.81);History of falling (Z91.81);Difficulty in walking, not elsewhere classified (R26.2);Pain Pain - Right/Left: Right Pain - part of body: Knee     Time: 4114-6431 PT Time Calculation (min) (ACUTE ONLY): 32 min  Charges:  $Therapeutic Exercise: 8-22 mins $Therapeutic Activity: 8-22 mins                     Kreg Shropshire, DPT 04/16/2018, 5:41 PM

## 2018-04-16 NOTE — Progress Notes (Signed)
[facility] at [place] NAME: Misty Trevino    MR#:  [id number]  DATE OF BIRTH:  1920-12-06  SUBJECTIVE:  Patient continues to have some shortness of breath.  Family at bedside.  Physical therapy is recommending skilled nursing facility upon discharge.  REVIEW OF SYSTEMS:    Review of Systems  Constitutional: Negative for fever, chills weight loss HENT: Negative for ear pain, nosebleeds, congestion, facial swelling, rhinorrhea, neck pain, neck stiffness and ear discharge.   Respiratory: Positive for cough, shortness of breath, no wheezing  Cardiovascular: Negative for chest pain, palpitations and leg swelling.  Gastrointestinal: Negative for heartburn, abdominal pain, vomiting, diarrhea or consitpation Genitourinary: Negative for dysuria, urgency, frequency, hematuria Musculoskeletal: Negative for back pain or joint pain Neurological: Negative for dizziness, seizures, syncope, focal weakness,  numbness and headaches.  Hematological: Does not bruise/bleed easily.  Psychiatric/Behavioral: Negative for hallucinations, confusion, dysphoric mood    Tolerating Diet: yes      DRUG ALLERGIES:  No Known Allergies  VITALS:  Blood pressure 127/69, pulse 95, temperature 97.9 F (36.6 C), temperature source Oral, resp. rate 16, height 5\' 4"  (1.626 m), weight 53.6 kg, SpO2 92 %.  PHYSICAL EXAMINATION:  Constitutional: Appears well-developed and well-nourished. No distress. HENT: Normocephalic. Marland Kitchen Oropharynx is clear and moist.  Eyes: Conjunctivae and EOM are normal. PERRLA, no scleral icterus.  Neck: Normal ROM. Neck supple. No JVD. No tracheal deviation. CVS: RRR, S1/S2 +, no murmurs, no gallops, no carotid bruit.  Pulmonary: Bilateral basilar crackles without wheezing or rhonchi  abdominal: Soft. BS +,  no distension, tenderness, rebound or guarding.  Musculoskeletal: Normal range of motion. No edema and no tenderness.  Neuro: Alert. CN 2-12  grossly intact. No focal deficits. Skin: Skin is warm and dry. No rash noted. Psychiatric: Normal mood and affect.      LABORATORY PANEL:   CBC Recent Labs  Lab 04/16/18 0451  WBC 15.4*  HGB 11.0*  HCT 32.9*  PLT 355   ------------------------------------------------------------------------------------------------------------------  Chemistries  Recent Labs  Lab 04/12/18 0048  04/16/18 0451  NA 135   < > 137  K 4.9   < > 3.8  CL 96*   < > 100  CO2 25   < > 28  GLUCOSE 169*   < > 178*  BUN 25*   < > 21  CREATININE 0.84   < > 0.76  CALCIUM 9.5   < > 8.3*  MG  --    < > 1.8  AST 19  --   --   ALT 10  --   --   ALKPHOS 78  --   --   BILITOT 0.6  --   --    < > = values in this interval not displayed.   ------------------------------------------------------------------------------------------------------------------  Cardiac Enzymes Recent Labs  Lab 04/12/18 0534 04/12/18 0914 04/12/18 1602  TROPONINI <0.03 <0.03 <0.03   ------------------------------------------------------------------------------------------------------------------  RADIOLOGY:  No results found.   ASSESSMENT AND PLAN:   84 year old female with a history of sick sinus syndrome status post recent pacemaker, diabetes and PAF who presented to the hospital due to chest pain.  1.  Chest pain: Patient was evaluated by cardiology.  She has been ruled out for ACS with negative troponins.   2.  Acute hypoxic respiratory failure in the setting of acute systolic heart failure with ejection fraction of 35 to 40%: Continue IV Lasix Monitor intake and output with daily weight Repeat chest x-ray Wean oxygen  as tolerated Sepsis has been ruled out with negative blood cultures and no source. All antibiotics has been stopped  3.  Arrhythmia: Continue Eliquis  4.  Recent pacemaker status  5.  Severe protein calorie malnutrition: Continue nutritional supplements as recommended by  dietitian.  Patient would benefit from outpatient palliative care services upon discharge.  Physical therapy is recommending skilled nursing facility upon discharge.  Clinical social work consulted.   Management plans discussed with the patient and family and they are in agreement.  CODE STATUS: DNR  TOTAL TIME TAKING CARE OF THIS PATIENT: 25 minutes.     POSSIBLE D/C 1 to 2 days, DEPENDING ON CLINICAL CONDITION.   Noelle Sease M.D on 04/16/2018 at 12:45 PM  Between 7am to 6pm - Pager - [phone] After 6pm go to [email or web address] - password [email or web address]  Office  [phone]  CC: Primary care physician; Albina Billet, MD  Note: This dictation was prepared with Dragon dictation along with smaller phrase technology. Any transcriptional errors that result from this process are unintentional.

## 2018-04-17 DIAGNOSIS — I509 Heart failure, unspecified: Secondary | ICD-10-CM

## 2018-04-17 DIAGNOSIS — Z7189 Other specified counseling: Secondary | ICD-10-CM

## 2018-04-17 LAB — BASIC METABOLIC PANEL
Anion gap: 10 (ref 5–15)
BUN: 27 mg/dL — AB (ref 8–23)
CALCIUM: 8.6 mg/dL — AB (ref 8.9–10.3)
CO2: 29 mmol/L (ref 22–32)
CREATININE: 0.81 mg/dL (ref 0.44–1.00)
Chloride: 100 mmol/L (ref 98–111)
GFR calc non Af Amer: 60 mL/min — ABNORMAL LOW (ref >60)
Glucose, Bld: 129 mg/dL — ABNORMAL HIGH (ref 70–99)
Potassium: 3.6 mmol/L (ref 3.5–5.1)
SODIUM: 139 mmol/L (ref 135–145)

## 2018-04-17 LAB — CBC
HCT: 32.5 % — ABNORMAL LOW (ref 36.0–46.0)
Hemoglobin: 10.7 g/dL — ABNORMAL LOW (ref 12.0–15.0)
MCH: 31.6 pg (ref 26.0–34.0)
MCHC: 32.9 g/dL (ref 30.0–36.0)
MCV: 95.9 fL (ref 80.0–100.0)
NRBC: 0 % (ref 0.0–0.2)
PLATELETS: 384 10*3/uL (ref 150–400)
RBC: 3.39 MIL/uL — ABNORMAL LOW (ref 3.87–5.11)
RDW: 13.1 % (ref 11.5–15.5)
WBC: 14.6 10*3/uL — ABNORMAL HIGH (ref 4.0–10.5)

## 2018-04-17 LAB — GLUCOSE, CAPILLARY
Glucose-Capillary: 147 mg/dL — ABNORMAL HIGH (ref 70–99)
Glucose-Capillary: 201 mg/dL — ABNORMAL HIGH (ref 70–99)
Glucose-Capillary: 141 mg/dL — ABNORMAL HIGH (ref 70–99)
Glucose-Capillary: 191 mg/dL — ABNORMAL HIGH (ref 70–99)

## 2018-04-17 MED ORDER — FUROSEMIDE 40 MG PO TABS: 40.0000 mg | ORAL_TABLET | Freq: Two times a day (BID) | ORAL | 0 refills | Status: AC

## 2018-04-17 MED ORDER — DOXYCYCLINE HYCLATE 100 MG PO TABS: 100.0000 mg | ORAL_TABLET | Freq: Two times a day (BID) | ORAL | 0 refills | Status: AC

## 2018-04-17 MED ORDER — GLUCERNA SHAKE PO LIQD: 237.0000 mL | Freq: Three times a day (TID) | ORAL | 0 refills | Status: AC

## 2018-04-17 MED ORDER — DOXYCYCLINE HYCLATE 100 MG PO TABS
100.0000 mg | ORAL_TABLET | Freq: Two times a day (BID) | ORAL | Status: DC
  Administered 2018-04-17 – 2018-04-20 (×7): 100 mg via ORAL
  Filled 2018-04-17 (×7): qty 1

## 2018-04-17 MED ORDER — FUROSEMIDE 10 MG/ML IJ SOLN
30.0000 mg | Freq: Two times a day (BID) | INTRAMUSCULAR | Status: DC
  Administered 2018-04-17 – 2018-04-20 (×6): 30 mg via INTRAVENOUS
  Filled 2018-04-17 (×6): qty 4

## 2018-04-17 MED ORDER — CARVEDILOL 3.125 MG PO TABS: 3.1250 mg | ORAL_TABLET | Freq: Two times a day (BID) | ORAL | 0 refills | Status: AC

## 2018-04-17 MED ORDER — LISINOPRIL 2.5 MG PO TABS: 2.5000 mg | ORAL_TABLET | Freq: Every day | ORAL | 0 refills | Status: AC

## 2018-04-17 NOTE — Discharge Summary (Addendum)
[facility] at [place] NAME: Misty Trevino    MR#:  [id number]  DATE OF BIRTH:  29-Apr-1921  DATE OF ADMISSION:  04/12/2018 ADMITTING PHYSICIAN: Harrie Foreman, MD  DATE OF DISCHARGE: 04/20/2018  PRIMARY CARE PHYSICIAN: Albina Billet, MD    ADMISSION DIAGNOSIS:  Chest pain, unspecified type [R07.9]  DISCHARGE DIAGNOSIS:  Active Problems:   Chest pain     SECONDARY DIAGNOSIS:   Past Medical History:  Diagnosis Date  . Diabetes mellitus without complication ([place])   . Dyspnea    chronic doe  . Dysrhythmia    afib / block  . Edema    pedal  . GERD (gastroesophageal reflux disease)   . History of orthopnea   . Hypertension   . Sick sinus syndrome ([place])   . Stroke ([place])    tia 2 years ago    HOSPITAL COURSE:   84 year old female with a history of sick sinus syndrome status post recent pacemaker, diabetes and PAF who presented to the hospital due to chest pain.  1.  Chest pain: Patient was evaluated by cardiology.  She has been ruled out for ACS with negative troponins.   2.  Acute hypoxic respiratory failure in the setting of acute systolic heart failure with ejection fraction of 35 to 40%: She will be discharged on oral Lasix, Coreg and lisinopril.  She has a referral for CHF clinic upon discharge.  She will need oxygen upon discharge which can be weaned as an outpatient if indicated.  Her repeat chest x-ray shows much improvement in pulmonary edema.  It also shows some consolidation in the left lower lobe.  She was advised to use incentive spirometer.  She was empirically started on doxycycline for possible pneumonia.  Sepsis has been ruled out with negative blood cultures and no source.   3.  Arrhythmia: Continue Eliquis  4.  Recent pacemaker status  5.  Severe protein calorie malnutrition: Continue nutritional supplements as recommended by dietitian.  Patient would benefit from outpatient palliative care  services upon discharge.    DISCHARGE CONDITIONS AND DIET:  Stable for discharge on diabetic heart healthy diet  CONSULTS OBTAINED:  Treatment Team:  Yolonda Kida, MD Tsosie Billing, MD [name], MD  DRUG ALLERGIES:  No Known Allergies  DISCHARGE MEDICATIONS:   Allergies as of 04/17/2018   No Known Allergies     Medication List    TAKE these medications   allopurinol 100 MG tablet Commonly known as:  ZYLOPRIM Take 100 mg by mouth every evening.   carboxymethylcellulose 0.5 % Soln Commonly known as:  REFRESH PLUS Place 1 drop into both eyes every evening.   carvedilol 3.125 MG tablet Commonly known as:  COREG Take 1 tablet (3.125 mg total) by mouth 2 (two) times daily with a meal.   cholecalciferol 1000 units tablet Commonly known as:  VITAMIN D Take 1,000 Units by mouth daily.   doxycycline 100 MG tablet Commonly known as:  VIBRA-TABS Take 1 tablet (100 mg total) by mouth every 12 (twelve) hours for 5 days.   ELIQUIS 2.5 MG Tabs tablet Generic drug:  apixaban Take 2.5 mg by mouth 2 (two) times daily.   feeding supplement (GLUCERNA SHAKE) Liqd Take 237 mLs by mouth 3 (three) times daily between meals.   furosemide 40 MG tablet Commonly known as:  LASIX Take 1 tablet (40 mg total) by mouth 2 (two) times daily.   lisinopril 2.5 MG tablet Commonly known  as:  PRINIVIL,ZESTRIL Take 1 tablet (2.5 mg total) by mouth daily. Start taking on:  04/18/2018   metFORMIN 850 MG tablet Commonly known as:  GLUCOPHAGE Take 850 mg by mouth daily with breakfast.   PRESERVISION AREDS 2 PO Take 1 capsule by mouth 2 (two) times daily.   triamcinolone cream 0.1 % Commonly known as:  KENALOG Apply 1 application topically at bedtime.         Today   CHIEF COMPLAINT:  Family at bedside.  Patient with weakness.  And cough however shortness of breath is improved   VITAL SIGNS:  Blood pressure (!) 114/52, pulse (!) 59, temperature (!) 97.4  F (36.3 C), temperature source Oral, resp. rate 18, height 5\' 4"  (1.626 m), weight 51.3 kg, SpO2 92 %.   REVIEW OF SYSTEMS:  Review of Systems  Constitutional: Negative.  Negative for chills, fever and malaise/fatigue.  HENT: Negative.  Negative for ear discharge, ear pain, hearing loss, nosebleeds and sore throat.   Eyes: Negative.  Negative for blurred vision and pain.  Respiratory: Positive for cough. Negative for hemoptysis, shortness of breath and wheezing.   Cardiovascular: Negative.  Negative for chest pain, palpitations and leg swelling.  Gastrointestinal: Negative.  Negative for abdominal pain, blood in stool, diarrhea, nausea and vomiting.  Genitourinary: Negative.  Negative for dysuria.  Musculoskeletal: Negative.  Negative for back pain.  Skin: Negative.   Neurological: Negative for dizziness, tremors, speech change, focal weakness, seizures and headaches.  Endo/Heme/Allergies: Negative.  Does not bruise/bleed easily.  Psychiatric/Behavioral: Negative.  Negative for depression, hallucinations and suicidal ideas.     PHYSICAL EXAMINATION:  GENERAL:  84 y.o.-year-old patient lying in the bed with no acute distress.  Thin and frail NECK:  Supple, no jugular venous distention. No thyroid enlargement, no tenderness.  LUNGS: Decreased breath sound at the left base without wheezing, rales,rhonchi  No use of accessory muscles of respiration.  CARDIOVASCULAR: S1, S2 normal. No murmurs, rubs, or gallops.  ABDOMEN: Soft, non-tender, non-distended. Bowel sounds present. No organomegaly or mass.  EXTREMITIES: No pedal edema, cyanosis, or clubbing.  PSYCHIATRIC: The patient is alert and oriented x 3.  SKIN: No obvious rash, lesion, or ulcer.   DATA REVIEW:   CBC Recent Labs  Lab 04/17/18 0435  WBC 14.6*  HGB 10.7*  HCT 32.5*  PLT 384    Chemistries  Recent Labs  Lab 04/12/18 0048  04/16/18 0451 04/17/18 0435  NA 135   < > 137 139  K 4.9   < > 3.8 3.6  CL 96*   < >  100 100  CO2 25   < > 28 29  GLUCOSE 169*   < > 178* 129*  BUN 25*   < > 21 27*  CREATININE 0.84   < > 0.76 0.81  CALCIUM 9.5   < > 8.3* 8.6*  MG  --    < > 1.8  --   AST 19  --   --   --   ALT 10  --   --   --   ALKPHOS 78  --   --   --   BILITOT 0.6  --   --   --    < > = values in this interval not displayed.    Cardiac Enzymes Recent Labs  Lab 04/12/18 0534 04/12/18 0914 04/12/18 1602  TROPONINI <0.03 <0.03 <0.03    Microbiology Results  @[email or web address] @  RADIOLOGY:  Dg Chest 1 View  Result Date: 04/16/2018  CLINICAL DATA:  Acute shortness of breath. EXAM: CHEST  1 VIEW COMPARISON:  04/13/2018 and prior radiographs FINDINGS: Mild cardiomegaly and slightly decreased pulmonary vascular congestion noted. Small bilateral pleural effusions, RIGHT greater than LEFT, and LEFT LOWER lung atelectasis/consolidation noted. No pneumothorax. A LEFT pacemaker is again noted. IMPRESSION: Mild cardiomegaly with slightly decreased pulmonary vascular congestion. Small bilateral pleural effusions, RIGHT greater than LEFT, and continued LEFT LOWER lung consolidation/atelectasis. Electronically Signed   By: Margarette Canada M.D.   On: 04/16/2018 14:12      Allergies as of 04/17/2018   No Known Allergies     Medication List    TAKE these medications   allopurinol 100 MG tablet Commonly known as:  ZYLOPRIM Take 100 mg by mouth every evening.   carboxymethylcellulose 0.5 % Soln Commonly known as:  REFRESH PLUS Place 1 drop into both eyes every evening.   carvedilol 3.125 MG tablet Commonly known as:  COREG Take 1 tablet (3.125 mg total) by mouth 2 (two) times daily with a meal.   cholecalciferol 1000 units tablet Commonly known as:  VITAMIN D Take 1,000 Units by mouth daily.   doxycycline 100 MG tablet Commonly known as:  VIBRA-TABS Take 1 tablet (100 mg total) by mouth every 12 (twelve) hours for 5 days.   ELIQUIS 2.5 MG Tabs tablet Generic drug:  apixaban Take 2.5 mg by mouth 2  (two) times daily.   feeding supplement (GLUCERNA SHAKE) Liqd Take 237 mLs by mouth 3 (three) times daily between meals.   furosemide 40 MG tablet Commonly known as:  LASIX Take 1 tablet (40 mg total) by mouth 2 (two) times daily.   lisinopril 2.5 MG tablet Commonly known as:  PRINIVIL,ZESTRIL Take 1 tablet (2.5 mg total) by mouth daily. Start taking on:  04/18/2018   metFORMIN 850 MG tablet Commonly known as:  GLUCOPHAGE Take 850 mg by mouth daily with breakfast.   PRESERVISION AREDS 2 PO Take 1 capsule by mouth 2 (two) times daily.   triamcinolone cream 0.1 % Commonly known as:  KENALOG Apply 1 application topically at bedtime.          Management plans discussed with the patient and she is in agreement. Stable for discharge   Patient should follow up with pcp  CODE STATUS:     Code Status Orders  (From admission, onward)         Start     Ordered   04/12/18 1142  Do not attempt resuscitation (DNR)  Continuous    Question Answer Comment  In the event of cardiac or respiratory ARREST Do not call a "code blue"   In the event of cardiac or respiratory ARREST Do not perform Intubation, CPR, defibrillation or ACLS   In the event of cardiac or respiratory ARREST Use medication by any route, position, wound care, and other measures to relive pain and suffering. May use oxygen, suction and manual treatment of airway obstruction as needed for comfort.      04/12/18 1141        Code Status History    Date Active Date Inactive Code Status Order ID Comments User Context   04/12/2018 0355 04/12/2018 1141 Full Code [id number]  Harrie Foreman, MD Inpatient   02/25/2018 1820 02/26/2018 1250 Full Code [id number]  [name], MD Inpatient    Advance Directive Documentation     Most Recent Value  Type of Advance Directive  Healthcare Power of Attorney  Pre-existing out of facility DNR order (yellow  form or pink MOST form)  -  "MOST" Form in Place?  -       TOTAL TIME TAKING CARE OF THIS PATIENT: 38 minutes.    Note: This dictation was prepared with Dragon dictation along with smaller phrase technology. Any transcriptional errors that result from this process are unintentional.  Doil Kamara M.D on 04/17/2018 at 10:26 AM  Between 7am to 6pm - Pager - [phone] After 6pm go to [email or web address] - password [email or web address] [facility]  Office  [phone]  CC: Primary care physician; Albina Billet, MD

## 2018-04-17 NOTE — NC FL2 (Signed)
[place] LEVEL OF CARE SCREENING TOOL     IDENTIFICATION  Patient Name: CASHAE WEICH Birthdate: 1921-01-20 Sex: female Admission Date (Current Location): 04/12/2018  [place] and [place] Number:  Engineering geologist and Address:  [facility], [address] [place], [place] [address]      Provider Number: [id number]  Attending Physician Name and Address:  Bettey Costa, MD  Relative Name and Phone Number:  Ethlyn Gallery Daughter [phone] or Maeven, Mcdougall Daughter   [phone]     Current Level of Care: Hospital Recommended Level of Care: [place] Prior Approval Number:    Date Approved/Denied:   PASRR Number: [id number]  Discharge Plan: SNF    Current Diagnoses: Patient Active Problem List   Diagnosis Date Noted  . Chest pain 04/12/2018  . Sepsis ([place]) 04/12/2018  . Mobitz type 2 second degree heart block 02/25/2018    Orientation RESPIRATION BLADDER Height & Weight     Self, Time, Situation, Place  O2(1 Liter) External catheter Weight: 113 lb (51.3 kg) Height:  5\' 4"  (162.6 cm)  BEHAVIORAL SYMPTOMS/MOOD NEUROLOGICAL BOWEL NUTRITION STATUS      Continent Diet(Carb modified)  AMBULATORY STATUS COMMUNICATION OF NEEDS Skin   Limited Assist Verbally Normal                       Personal Care Assistance Level of Assistance  Bathing, Dressing, Feeding Bathing Assistance: Limited assistance Feeding assistance: Independent Dressing Assistance: Limited assistance     Functional Limitations Info  Speech, Hearing, Sight Sight Info: Adequate Hearing Info: Adequate Speech Info: Adequate    SPECIAL CARE FACTORS FREQUENCY  PT (By licensed PT), OT (By licensed OT)     PT Frequency: 5x a week OT Frequency: 5x a week            Contractures Contractures Info: Not present    Additional Factors Info  Code Status, Allergies Code Status Info: DNR Allergies Info: NKA            Current Medications (04/17/2018):  This is the current hospital active medication list Current Facility-Administered Medications  Medication Dose Route Frequency Provider Last Rate Last Dose  . 0.9 %  sodium chloride infusion   Intravenous PRN Demetrios Loll, MD 10 mL/hr at 04/15/18 1343 500 mL at 04/15/18 1343  . acetaminophen (TYLENOL) tablet 650 mg  650 mg Oral Q6H PRN Harrie Foreman, MD   650 mg at 04/16/18 2222   Or  . acetaminophen (TYLENOL) suppository 650 mg  650 mg Rectal Q6H PRN Harrie Foreman, MD      . allopurinol (ZYLOPRIM) tablet 100 mg  100 mg Oral QPM Harrie Foreman, MD   100 mg at 04/16/18 1737  . apixaban (ELIQUIS) tablet 2.5 mg  2.5 mg Oral BID Harrie Foreman, MD   2.5 mg at 04/17/18 0932  . carvedilol (COREG) tablet 3.125 mg  3.125 mg Oral BID WC Demetrios Loll, MD   3.125 mg at 04/17/18 0932  . cholecalciferol (VITAMIN D) tablet 1,000 Units  1,000 Units Oral Daily Harrie Foreman, MD   1,000 Units at 04/17/18 0932  . diphenhydrAMINE (BENADRYL) capsule 25 mg  25 mg Oral QHS PRN Lance Coon, MD   25 mg at 04/15/18 2114  . docusate sodium (COLACE) capsule 100 mg  100 mg Oral BID Harrie Foreman, MD   100 mg at 04/17/18 0931  . doxycycline (VIBRA-TABS) tablet 100 mg  100 mg Oral Q12H Bettey Costa, MD   100 mg at 04/17/18 0931  . feeding supplement (GLUCERNA SHAKE) (GLUCERNA SHAKE) liquid 237 mL  237 mL Oral TID BM Mody, Sital, MD   237 mL at 04/17/18 0943  . furosemide (LASIX) injection 30 mg  30 mg Intravenous Q12H Mody, Sital, MD      . guaiFENesin-dextromethorphan (ROBITUSSIN DM) 100-10 MG/5ML syrup 5 mL  5 mL Oral Q4H PRN Demetrios Loll, MD   5 mL at 04/14/18 1215  . insulin aspart (novoLOG) injection 0-5 Units  0-5 Units Subcutaneous QHS Harrie Foreman, MD   2 Units at 04/12/18 2126  . insulin aspart (novoLOG) injection 0-9 Units  0-9 Units Subcutaneous TID WC Harrie Foreman, MD   1 Units at 04/17/18 0919  . lisinopril (PRINIVIL,ZESTRIL) tablet 2.5 mg   2.5 mg Oral Daily Demetrios Loll, MD   2.5 mg at 04/17/18 0932  . MEDLINE mouth rinse  15 mL Mouth Rinse BID Harrie Foreman, MD   15 mL at 04/16/18 2223  . multivitamin with minerals tablet 1 tablet  1 tablet Oral Daily Demetrios Loll, MD   1 tablet at 04/17/18 0931  . nitroGLYCERIN (NITROGLYN) 2 % ointment 0.5 inch  0.5 inch Topical Q6H Harrie Foreman, MD   0.5 inch at 04/17/18 0540  . ondansetron (ZOFRAN) tablet 4 mg  4 mg Oral Q6H PRN Harrie Foreman, MD       Or  . ondansetron [facility]) injection 4 mg  4 mg Intravenous Q6H PRN Harrie Foreman, MD      . polyvinyl alcohol (LIQUIFILM TEARS) 1.4 % ophthalmic solution 1 drop  1 drop Both Eyes QPM Harrie Foreman, MD   1 drop at 04/16/18 1737  . sodium chloride flush (NS) 0.9 % injection 3 mL  3 mL Intravenous Q12H Bettey Costa, MD   3 mL at 04/17/18 0931  . triamcinolone cream (KENALOG) 0.1 % 1 application  1 application Topical QHS Harrie Foreman, MD   1 application at 24/58/09 2234  . vitamin C (ASCORBIC ACID) tablet 250 mg  250 mg Oral BID Bettey Costa, MD   250 mg at 04/17/18 9833     Discharge Medications: Please see discharge summary for a list of discharge medications.  Relevant Imaging Results:  Relevant Lab Results:   Additional Information SSN [id number]  Ross Ludwig, [place]

## 2018-04-17 NOTE — Plan of Care (Signed)
Patient is afebrile. Encourage patient to increase activity. Limit long sitting time in recliner, caused patient hip pain.

## 2018-04-17 NOTE — Clinical Social Work Note (Addendum)
CSW spoke with patient and family and they would like to go to [facility].  CSW contacted [facility] and they can accept tomorrow if insurance has been approved.  CSW updated patient's family and bedside nurse that [facility] has started insurance auth and if she is approved, they can accept her tomorrow.  Palliative is recommending palliative to follow at SNF, Hospice and Palliative of  and [place] liaison aware.  CSW to continue to follow patient's progress throughout discharge planning.  5:00pm  CSW contacted [facility] to get an update on insurance authorization, Otila Kluver said it is still pending, CSW attempted to contact patient's daughter Jan at  [phone], left a message on her voice mail to let her know Josem Kaufmann was still pending.  Jones Broom. Ulises Wolfinger, MSW, [place]  04/17/2018 3:38 PM

## 2018-04-17 NOTE — Progress Notes (Signed)
Daily Progress Note   Patient Name: Misty Trevino       Date: 04/17/2018 DOB: September 02, 1920  Age: 84 y.o. MRN#: [id number] Attending Physician: Bettey Costa, MD Primary Care Physician: Albina Billet, MD Admit Date: 04/12/2018  Reason for Consultation/Follow-up: Establishing goals of care  Subjective: Met with patient today. Daughter Jan and great-grandaughter at bedside. Plans to send patient to SNF. Ms Brenes states her ability to walk is very important to her quality of life, and if she does not do well with rehab, "I'll just call you and tell you just let me go I'm done".   The difference between an aggressive medical intervention path and a comfort care path was discussed.  Values and goals of care important to patient and family were attempted to be elicited. She states she is tired. She is tired of coming to the hospital, and being stuck with needles and having other procedures. She wants to go home, but wants to go to rehab to attempt to improve first. We discussed appetite which is poor here in the hospital. Family states it is good outside of the hospital.   Recommend D/C with palliative with transition to hospice when ready.    Length of Stay: 5  Current Medications: Scheduled Meds:  . allopurinol  100 mg Oral QPM  . apixaban  2.5 mg Oral BID  . carvedilol  3.125 mg Oral BID WC  . cholecalciferol  1,000 Units Oral Daily  . docusate sodium  100 mg Oral BID  . doxycycline  100 mg Oral Q12H  . feeding supplement (GLUCERNA SHAKE)  237 mL Oral TID BM  . furosemide  30 mg Intravenous Q12H  . insulin aspart  0-5 Units Subcutaneous QHS  . insulin aspart  0-9 Units Subcutaneous TID WC  . lisinopril  2.5 mg Oral Daily  . mouth rinse  15 mL Mouth Rinse BID  . multivitamin with  minerals  1 tablet Oral Daily  . nitroGLYCERIN  0.5 inch Topical Q6H  . polyvinyl alcohol  1 drop Both Eyes QPM  . sodium chloride flush  3 mL Intravenous Q12H  . triamcinolone cream  1 application Topical QHS  . vitamin C  250 mg Oral BID    Continuous Infusions: . sodium chloride 500 mL (04/15/18 1343)    PRN Meds:  sodium chloride, acetaminophen **OR** acetaminophen, diphenhydrAMINE, guaiFENesin-dextromethorphan, ondansetron **OR** ondansetron (ZOFRAN) IV  Physical Exam  Constitutional: No distress.  Pulmonary/Chest: Effort normal.  Neurological: She is alert.            Vital Signs: BP (!) 114/52 (BP Location: Left Arm)   Pulse (!) 59   Temp (!) 97.4 F (36.3 C) (Oral)   Resp 18   Ht '5\' 4"'$  (1.626 m)   Wt 51.3 kg   SpO2 92%   BMI 19.40 kg/m  SpO2: SpO2: 92 % O2 Device: O2 Device: Nasal Cannula O2 Flow Rate: O2 Flow Rate (L/min): 1 L/min  Intake/output summary:   Intake/Output Summary (Last 24 hours) at 04/17/2018 1313 Last data filed at 04/17/2018 0931 Gross per 24 hour  Intake 3 ml  Output 1100 ml  Net -1097 ml   LBM: Last BM Date: 04/17/18 Baseline Weight: Weight: 50 kg Most recent weight: Weight: 51.3 kg       Palliative Assessment/Data:      Patient Active Problem List   Diagnosis Date Noted  . Chest pain 04/12/2018  . Sepsis ([place]) 04/12/2018  . Mobitz type 2 second degree heart block 02/25/2018    Palliative Care Assessment & Plan    Recommendations/Plan:  SNF with palliative. Transition to hospice when ready.    Code Status:    Code Status Orders  (From admission, onward)         Start     Ordered   04/12/18 1142  Do not attempt resuscitation (DNR)  Continuous    Question Answer Comment  In the event of cardiac or respiratory ARREST Do not call a "code blue"   In the event of cardiac or respiratory ARREST Do not perform Intubation, CPR, defibrillation or ACLS   In the event of cardiac or respiratory ARREST Use medication by  any route, position, wound care, and other measures to relive pain and suffering. May use oxygen, suction and manual treatment of airway obstruction as needed for comfort.      04/12/18 1141        Code Status History    Date Active Date Inactive Code Status Order ID Comments User Context   04/12/2018 0355 04/12/2018 1141 Full Code [id number]  Harrie Foreman, MD Inpatient   02/25/2018 1820 02/26/2018 1250 Full Code [id number]  [name], MD Inpatient    Advance Directive Documentation     Most Recent Value  Type of Advance Directive  Healthcare Power of Attorney  Pre-existing out of facility DNR order (yellow form or pink MOST form)  -  "MOST" Form in Place?  -       Prognosis:   Poor if appetite continues to be poor.   Discharge Planning:  [place] for rehab with Palliative care service follow-up    Thank you for allowing the Palliative Medicine Team to assist in the care of this patient.   Time In: 10:40 Time Out: 11:50 Total Time 70 min Prolonged Time Billed  yes      Greater than 50%  of this time was spent counseling and coordinating care related to the above assessment and plan.  Asencion Gowda, NP  Please contact Palliative Medicine Team phone at [phone] for questions and concerns.

## 2018-04-17 NOTE — Clinical Social Work Note (Signed)
Clinical Social Work Assessment  Patient Details  Name: Misty Trevino MRN: [id number] Date of Birth: [birth year]  Date of referral:  04/17/18               Reason for consult:  Facility Placement                Permission sought to share information with:  Family Supports, Customer service manager Permission granted to share information::  Yes, Verbal Permission Granted  Name::     [place] Daughter   [phone] or Junie, [place] Daughter   [phone] or Sofya, [place] Daughter   [phone]   Agency::  SNF admissions  Relationship::     Contact Information:     Housing/Transportation Living arrangements for the past 2 months:  Single Family Home Source of Information:  Patient, Adult Children Patient Interpreter Needed:  None Criminal Activity/Legal Involvement Pertinent to Current Situation/Hospitalization:  No - Comment as needed Significant Relationships:  Adult Children Lives with:  Self Do you feel safe going back to the place where you live?  No Need for family participation in patient care:  Yes (Comment)  Care giving concerns:  Patient and family feel she needs some short term rehab before going back home.   Social Worker assessment / plan:  Patient is a 84 year old female who is alert and oriented x4.  Patient states that she has not been to SNF for short term rehab, CSW explained to patient and her daughter Jan who was at bedside what to expect at [facility] and process for working on finding placement for her.  CSW explained how insurance will pay for stay at SNF and how the approval process works.  Patient states she would like to go to [facility] if possible for rehab.  CSW told her patient would be transported to SNF, and how the referral process is completed.  Patient and her daughter were informed that if insurance does not pay for SNF, then she would have to look at home with home health or private paying for SNF.   Patient's daughter stated that patient  can not afford to pay privately.  CSW informed her these were the only other options if insurance does not approve.  Patient and her daughter expressed understanding and gave CSW permission go to begin bed search in [place].  Employment status:  Retired Nurse, adult PT Recommendations:  [place] / Referral to community resources:  [place]  Patient/Family's Response to care:  Patient and family are agreeable to going to SNF for short term rehab.  Patient/Family's Understanding of and Emotional Response to Diagnosis, Current Treatment, and Prognosis:  Patient expressed she does not really want to go to SNF but she understands that it would be the best option currenlty.  Emotional Assessment Appearance:  Appears stated age Attitude/Demeanor/Rapport:    Affect (typically observed):  Appropriate Orientation:  Oriented to Self, Oriented to Place, Oriented to Situation Alcohol / Substance use:  Not Applicable Psych involvement (Current and /or in the community):  No (Comment)  Discharge Needs  Concerns to be addressed:  No discharge needs identified Readmission within the last 30 days:  No Current discharge risk:  None Barriers to Discharge:  Continued Medical Work up   Ross Ludwig, [place] 04/17/2018, 5:00 PM

## 2018-04-17 NOTE — Progress Notes (Signed)
New referral for outpatient Palliative to follow at [facility] received from [place]. Possible discharge tomorrow. Patient information faxed to referral. Flo Shanks RN, BSN, [facility] [place], hospital Liaison [phone]

## 2018-04-17 NOTE — Evaluation (Signed)
Occupational Therapy Evaluation Patient Details Name: Misty Trevino MRN: [id number] DOB: 11-19-1920 Today's Date: 04/17/2018    History of Present Illness Pt is a 84 y.o. female presenting to hospital 04/12/18 with generalized chest pain with radiation to L arm and neck, weakness, and SOB.  Pt admitted with chest pain, sepsis, leukocytosis, and acute respiratory failure with hypoxia d/t acute CHF.  PMH includes heart block s/p pacemaker 02/25/18, DM, dyspnea, edema, dysrhythmia, htn, R shoulder arthroscopy x2, stroke, SSS, h/o orthopnea, back surgery, eye surgery.  Pt also with thin skin and bruises easily.   Clinical Impression   Pt is 84 year old female who presents to [facility] hospital with chest pain and SOB and has a pacemaker.  See above for past medical hx.  She was independent in all ADLs using a rollator and lived with her nephew who worked during the day and her 2 daughters would check in with her as needed.  She has decreased AROM of RUE due to rotator cuff problems and shoulder limited to about 80 degrees with limited rotation and has pain in R knee but during evaluation she stated she did not have any pain.  She is able to complete feeding, grooming and UB dressing with min assist and needs total assist for hygiene after using BSC with 2 person mod assist with mod cues and for LB dressing skills.  She is limited in functional endurance and would benefit from education in pacing and energy conservation tech and use of AD for LB dressing to help conserve energy.  Pt's daughter present for evaluation but stepped out during [facility] use.  Pt's daughter was crying outside of room and overwhelmed by this and her husband having triple bypass surgery recently.  Pt would benefit from skilled OT services to increase independence in ADLs using AD, education in energy conservation techniques and recommendations for home modifications to increase safety and prevent falls.       Follow Up Recommendations  SNF     Equipment Recommendations  Other (comment)(pt would benefit from using reacher and sock aid and elastic shoe laces)    Recommendations for Other Services       Precautions / Restrictions Precautions Precautions: Fall Restrictions Weight Bearing Restrictions: No      Mobility Bed Mobility                  Transfers                      Balance                                           ADL either performed or assessed with clinical judgement   ADL Overall ADL's : Needs assistance/impaired Eating/Feeding: Minimal assistance;Set up;Bed level   Grooming: Wash/dry hands;Wash/dry face;Oral care;Set up;Brushing hair;Bed level Grooming Details (indicate cue type and reason): using both hands due to decreased AROM RUE          Upper Body Dressing : Set up;Minimal assistance   Lower Body Dressing: Total assistance;Set up Lower Body Dressing Details (indicate cue type and reason): Pt unable to reach feet while sitting at EOB due to weakness and fear of falling Toilet Transfer: +2 for safety/equipment;Set up;Moderate assistance;BSC;Cueing for sequencing Toilet Transfer Details (indicate cue type and reason): Did better and needed less assist after standing but needs cues to sequence  to turn with FWW to sit on [facility] and reacher back for bed when getting off of BSC and back to bed.  She was incontinent of bowel but could feel that she was having a BM prior to transfer to [facility].  Daughter stated she was not able to urinate at all last night.           General ADL Comments: Pt too fatigued to ambulate at all during session after getting up to use BSC. Dependent for hygiene.     Vision Baseline Vision/History: Wears glasses Wears Glasses: At all times Patient Visual Report: No change from baseline       Perception     Praxis      Pertinent Vitals/Pain Pain Assessment: No/denies pain     Hand Dominance Right   Extremity/Trunk Assessment  Upper Extremity Assessment Upper Extremity Assessment: RUE deficits/detail RUE Deficits / Details: Decreased AROM RUE to about 80 degrees shoulder flexion   Lower Extremity Assessment Lower Extremity Assessment: Defer to PT evaluation   Cervical / Trunk Assessment Cervical / Trunk Assessment: Kyphotic   Communication Communication Communication: No difficulties   Cognition Arousal/Alertness: Awake/alert Behavior During Therapy: WFL for tasks assessed/performed Overall Cognitive Status: Within Functional Limits for tasks assessed                                     General Comments       Exercises     Shoulder Instructions      Home Living Family/patient expects to be discharged to:: Private residence Living Arrangements: Other relatives Available Help at Discharge: Family Type of Home: House Home Access: Stairs to enter [employer] of Steps: 2 steps to enter with grab bar on R   Home Layout: One level     Bathroom Shower/Tub: Teacher, early years/pre: Handicapped height     Home Equipment: Grab bars - toilet;Walker - 4 wheels;Adaptive equipment Adaptive Equipment: Reacher;Sock aid        Prior Functioning/Environment Level of Independence: Needs assistance  Gait / Transfers Assistance Needed: Ambulatory with 4ww; h/o fall about 1 year ago ADL's / Homemaking Assistance Needed: Grandson (who works during the day) assists with vaccuuming, trash, household tasks as needed            OT Problem List: Decreased strength;Impaired balance (sitting and/or standing);Decreased activity tolerance      OT Treatment/Interventions: Self-care/ADL training;Patient/family education;Energy conservation;Balance training;Therapeutic activities    OT Goals(Current goals can be found in the care plan section) Acute Rehab OT Goals Patient Stated Goal: "to be able to care for myself like I was" OT Goal Formulation: With patient/family Time  For Goal Achievement: 05/01/18 Potential to Achieve Goals: Good ADL Goals Pt Will Perform Lower Body Dressing: with set-up;with max assist;sit to/from stand;with adaptive equipment Pt Will Transfer to Toilet: with set-up;with mod assist;bedside commode;stand pivot transfer Pt/caregiver will Perform Home Exercise Program: With written HEP provided  OT Frequency:     Barriers to D/C:    pt was living at home with her nephew who worked during the day and was independent in all ADLS then.       Co-evaluation              AM-PAC PT "6 Clicks" Daily Activity     Outcome Measure Help from another person eating meals?: A Little Help from another person taking care of personal grooming?: A  Little Help from another person toileting, which includes using toliet, bedpan, or urinal?: A Lot Help from another person bathing (including washing, rinsing, drying)?: A Lot Help from another person to put on and taking off regular upper body clothing?: A Little Help from another person to put on and taking off regular lower body clothing?: Total 6 Click Score: 14   End of Session    Activity Tolerance: Patient tolerated treatment well Patient left: in bed;with call bell/phone within reach;with bed alarm set;with nursing/sitter in room(Nurse in room as therapist left room and after bed alarm put back on)  OT Visit Diagnosis: Unsteadiness on feet (R26.81);History of falling (Z91.81);Muscle weakness (generalized) (M62.81)                Time: 0920-1000 OT Time Calculation (min): 40 min Charges:  OT General Charges $OT Visit: 1 Visit OT Evaluation $OT Eval Low Complexity: 1 Low OT Treatments $Self Care/Home Management : 38-52 mins  Chrys Racer, OTR/L ascom [phone] 04/17/18, 10:29 AM

## 2018-04-17 NOTE — Care Management Note (Signed)
Case Management Note  Patient Details  Name: Misty Trevino MRN: [id number] Date of Birth: 1920/09/01  Subjective/Objective:    Patient is a 84yo admitted with SOB.  Acute respiratory failure, continues on IV lasix.  Palliative has tried multiple times to identify goals of care with family.  Spoke with Dr. Benjie Karvonen this morning and the plan is to discharge to SNF.                 Action/Plan:   Expected Discharge Date:                  Expected Discharge Plan:     In-House Referral:     Discharge planning Services     Post Acute Care Choice:    Choice offered to:     DME Arranged:    DME Agency:     HH Arranged:    HH Agency:     Status of Service:  In process, will continue to follow  If discussed at Long Length of Stay Meetings, dates discussed:    Additional Comments:  Elza Rafter, RN 04/17/2018, 9:11 AM

## 2018-04-17 NOTE — Care Management Important Message (Signed)
Copy of signed IM left with patient in room.  

## 2018-04-17 NOTE — Plan of Care (Signed)
Patient is voiding after receiving lasix.

## 2018-04-17 NOTE — Progress Notes (Signed)
Patient has not voided since 6am this morning when the nurse did an in and out cath on her. First bladder scan showed 69ml, second one showed 88. Dr.Mody aware, orders to continue to monitor patient. Patient did have 2 bowel movements today on the [facility].

## 2018-04-18 LAB — GLUCOSE, CAPILLARY
Glucose-Capillary: 197 mg/dL — ABNORMAL HIGH (ref 70–99)
Glucose-Capillary: 254 mg/dL — ABNORMAL HIGH (ref 70–99)
Glucose-Capillary: 118 mg/dL — ABNORMAL HIGH (ref 70–99)
Glucose-Capillary: 157 mg/dL — ABNORMAL HIGH (ref 70–99)

## 2018-04-18 LAB — BASIC METABOLIC PANEL
ANION GAP: 10 (ref 5–15)
BUN: 26 mg/dL — ABNORMAL HIGH (ref 8–23)
CALCIUM: 8.7 mg/dL — AB (ref 8.9–10.3)
CHLORIDE: 96 mmol/L — AB (ref 98–111)
CO2: 30 mmol/L (ref 22–32)
Creatinine, Ser: 0.82 mg/dL (ref 0.44–1.00)
GFR calc Af Amer: >60 mL/min (ref >60)
GFR, EST NON AFRICAN AMERICAN: 59 mL/min — AB (ref >60)
GLUCOSE: 177 mg/dL — AB (ref 70–99)
POTASSIUM: 3.9 mmol/L (ref 3.5–5.1)
Sodium: 136 mmol/L (ref 135–145)

## 2018-04-18 NOTE — Progress Notes (Signed)
[facility] [place] NAME: Misty Trevino    MR#:  [id number]  DATE OF BIRTH:  1921-05-04  SUBJECTIVE:  Patient without acute events overnight Urinated after lasix given last night   REVIEW OF SYSTEMS:    Review of Systems  Constitutional: Negative for fever, chills weight loss HENT: Negative for ear pain, nosebleeds, congestion, facial swelling, rhinorrhea, neck pain, neck stiffness and ear discharge.   Respiratory: Positive for cough, shortness of breath, no wheezing  Cardiovascular: Negative for chest pain, palpitations and leg swelling.  Gastrointestinal: Negative for heartburn, abdominal pain, vomiting, diarrhea or consitpation Genitourinary: Negative for dysuria, urgency, frequency, hematuria Musculoskeletal: Negative for back pain or joint pain Neurological: Negative for dizziness, seizures, syncope, focal weakness,  numbness and headaches.  Hematological: Does not bruise/bleed easily.  Psychiatric/Behavioral: Negative for hallucinations, confusion, dysphoric mood    Tolerating Diet: yes      DRUG ALLERGIES:  No Known Allergies  VITALS:  Blood pressure 132/66, pulse 81, temperature (!) 97.5 F (36.4 C), temperature source Oral, resp. rate 18, height 5\' 4"  (1.626 m), weight 51.6 kg, SpO2 92 %.  PHYSICAL EXAMINATION:  Constitutional: Appears well-developed and well-nourished. No distress. HENT: Normocephalic. Marland Kitchen Oropharynx is clear and moist.  Eyes: Conjunctivae and EOM are normal. PERRLA, no scleral icterus.  Neck: Normal ROM. Neck supple. No JVD. No tracheal deviation. CVS: RRR, S1/S2 +, no murmurs, no gallops, no carotid bruit.  Pulmonary: Bilateral basilar crackles without wheezing or rhonchi  abdominal: Soft. BS +,  no distension, tenderness, rebound or guarding.  Musculoskeletal: Normal range of motion. No edema and no tenderness.  Neuro: Alert. CN 2-12 grossly intact. No focal deficits. Skin: Skin is warm and dry.  No rash noted. Psychiatric: Normal mood and affect.      LABORATORY PANEL:   CBC Recent Labs  Lab 04/17/18 0435  WBC 14.6*  HGB 10.7*  HCT 32.5*  PLT 384   ------------------------------------------------------------------------------------------------------------------  Chemistries  Recent Labs  Lab 04/12/18 0048  04/16/18 0451  04/18/18 0609  NA 135   < > 137   < > 136  K 4.9   < > 3.8   < > 3.9  CL 96*   < > 100   < > 96*  CO2 25   < > 28   < > 30  GLUCOSE 169*   < > 178*   < > 177*  BUN 25*   < > 21   < > 26*  CREATININE 0.84   < > 0.76   < > 0.82  CALCIUM 9.5   < > 8.3*   < > 8.7*  MG  --    < > 1.8  --   --   AST 19  --   --   --   --   ALT 10  --   --   --   --   ALKPHOS 78  --   --   --   --   BILITOT 0.6  --   --   --   --    < > = values in this interval not displayed.   ------------------------------------------------------------------------------------------------------------------  Cardiac Enzymes Recent Labs  Lab 04/12/18 0534 04/12/18 0914 04/12/18 1602  TROPONINI <0.03 <0.03 <0.03   ------------------------------------------------------------------------------------------------------------------  RADIOLOGY:  Dg Chest 1 View  Result Date: 04/16/2018 CLINICAL DATA:  Acute shortness of breath. EXAM: CHEST  1 VIEW COMPARISON:  04/13/2018 and prior radiographs FINDINGS: Mild cardiomegaly and  slightly decreased pulmonary vascular congestion noted. Small bilateral pleural effusions, RIGHT greater than LEFT, and LEFT LOWER lung atelectasis/consolidation noted. No pneumothorax. A LEFT pacemaker is again noted. IMPRESSION: Mild cardiomegaly with slightly decreased pulmonary vascular congestion. Small bilateral pleural effusions, RIGHT greater than LEFT, and continued LEFT LOWER lung consolidation/atelectasis. Electronically Signed   By: Margarette Canada M.D.   On: 04/16/2018 14:12     ASSESSMENT AND PLAN:   84 year old female with a history of sick sinus  syndrome status post recent pacemaker, diabetes and PAF who presented to the hospital due to chest pain.  1. Chest pain: Patient was evaluated by cardiology. She has been ruled out for ACS with negative troponins.   2. Acute hypoxic respiratory failure in the setting of acute systolic heart failure with ejection fraction of 35 to 40%: She will be discharged on oral Lasix, Coreg and lisinopril.  She has a referral for CHF clinic upon discharge.  She will need oxygen upon discharge which can be weaned as an outpatient if indicated.  Her repeat chest x-ray shows much improvement in pulmonary edema.  It also shows some consolidation in the left lower lobe.  She was advised to use incentive spirometer.  She was empirically started on doxycycline for possible pneumonia.  Sepsis has been ruled out with negative blood cultures and no source.   3. Arrhythmia: Continue Eliquis  4. Recent pacemaker status  5. Severe protein calorie malnutrition: Continue nutritional supplements as recommended by dietitian.  Patient would benefit from outpatient palliative care services upon discharge Management plans discussed with the patient and family and they are in agreement.  CODE STATUS: DNR  TOTAL TIME TAKING CARE OF THIS PATIENT: 25 minutes.     POSSIBLE D/C 1 to 2 days, DEPENDING ON CLINICAL CONDITION.   Orhan Mayorga M.D on 04/18/2018 at 9:42 AM  Between 7am to 6pm - Pager - [phone] After 6pm go to [email or web address] - password [email or web address] [facility]  Office  [phone]  CC: Primary care physician; Albina Billet, MD  Note: This dictation was prepared with Dragon dictation along with smaller phrase technology. Any transcriptional errors that result from this process are unintentional.

## 2018-04-18 NOTE — Clinical Social Work Note (Addendum)
CSW has sent a message to the admissions coordinator for [facility] to find out if the insurance authorization has been received. CSW is waiting for answer. CSW will update as information becomes available.  UPDATE: The authorization is still pending. The CSW will update if any new information comes from the facility.   Santiago Bumpers, MSW, Latanya Presser [phone]

## 2018-04-18 NOTE — Progress Notes (Signed)
[facility] at [place] NAME: Misty Trevino    MR#:  [id number]  DATE OF BIRTH:  [birth year]  SUBJECTIVE:  Patient doing well ready for d/c today to snf  REVIEW OF SYSTEMS:    Review of Systems  Constitutional: Negative for fever, chills weight loss HENT: Negative for ear pain, nosebleeds, congestion, facial swelling, rhinorrhea, neck pain, neck stiffness and ear discharge.   Respiratory: Positive for cough, shortness of breath, no wheezing  Cardiovascular: Negative for chest pain, palpitations and leg swelling.  Gastrointestinal: Negative for heartburn, abdominal pain, vomiting, diarrhea or consitpation Genitourinary: Negative for dysuria, urgency, frequency, hematuria Musculoskeletal: Negative for back pain or joint pain Neurological: Negative for dizziness, seizures, syncope, focal weakness,  numbness and headaches.  Hematological: Does not bruise/bleed easily.  Psychiatric/Behavioral: Negative for hallucinations, confusion, dysphoric mood    Tolerating Diet: yes      DRUG ALLERGIES:  No Known Allergies  VITALS:  Blood pressure 132/66, pulse 81, temperature (!) 97.5 F (36.4 C), temperature source Oral, resp. rate 18, height 5\' 4"  (1.626 m), weight 51.6 kg, SpO2 92 %.  PHYSICAL EXAMINATION:  Constitutional: Appears well-developed and well-nourished. No distress. HENT: Normocephalic. Marland Kitchen Oropharynx is clear and moist.  Eyes: Conjunctivae and EOM are normal. PERRLA, no scleral icterus.  Neck: Normal ROM. Neck supple. No JVD. No tracheal deviation. CVS: RRR, S1/S2 +, no murmurs, no gallops, no carotid bruit.  Pulmonary: Bilateral basilar crackles without wheezing or rhonchi  abdominal: Soft. BS +,  no distension, tenderness, rebound or guarding.  Musculoskeletal: Normal range of motion. No edema and no tenderness.  Neuro: Alert. CN 2-12 grossly intact. No focal deficits. Skin: Skin is warm and dry. No rash noted. Psychiatric:  Normal mood and affect.      LABORATORY PANEL:   CBC Recent Labs  Lab 04/17/18 0435  WBC 14.6*  HGB 10.7*  HCT 32.5*  PLT 384   ------------------------------------------------------------------------------------------------------------------  Chemistries  Recent Labs  Lab 04/12/18 0048  04/16/18 0451  04/18/18 0609  NA 135   < > 137   < > 136  K 4.9   < > 3.8   < > 3.9  CL 96*   < > 100   < > 96*  CO2 25   < > 28   < > 30  GLUCOSE 169*   < > 178*   < > 177*  BUN 25*   < > 21   < > 26*  CREATININE 0.84   < > 0.76   < > 0.82  CALCIUM 9.5   < > 8.3*   < > 8.7*  MG  --    < > 1.8  --   --   AST 19  --   --   --   --   ALT 10  --   --   --   --   ALKPHOS 78  --   --   --   --   BILITOT 0.6  --   --   --   --    < > = values in this interval not displayed.   ------------------------------------------------------------------------------------------------------------------  Cardiac Enzymes Recent Labs  Lab 04/12/18 0534 04/12/18 0914 04/12/18 1602  TROPONINI <0.03 <0.03 <0.03   ------------------------------------------------------------------------------------------------------------------  RADIOLOGY:  Dg Chest 1 View  Result Date: 04/16/2018 CLINICAL DATA:  Acute shortness of breath. EXAM: CHEST  1 VIEW COMPARISON:  04/13/2018 and prior radiographs FINDINGS: Mild cardiomegaly and slightly decreased pulmonary  vascular congestion noted. Small bilateral pleural effusions, RIGHT greater than LEFT, and LEFT LOWER lung atelectasis/consolidation noted. No pneumothorax. A LEFT pacemaker is again noted. IMPRESSION: Mild cardiomegaly with slightly decreased pulmonary vascular congestion. Small bilateral pleural effusions, RIGHT greater than LEFT, and continued LEFT LOWER lung consolidation/atelectasis. Electronically Signed   By: Margarette Canada M.D.   On: 04/16/2018 14:12     ASSESSMENT AND PLAN:   84 year old female with a history of sick sinus syndrome status post recent  pacemaker, diabetes and PAF who presented to the hospital due to chest pain.  1. Chest pain: Patient was evaluated by cardiology. She has been ruled out for ACS with negative troponins.   2. Acute hypoxic respiratory failure in the setting of acute systolic heart failure with ejection fraction of 35 to 40%: She will be discharged on oral Lasix, Coreg and lisinopril.  She has a referral for CHF clinic upon discharge.  She will need oxygen upon discharge which can be weaned as an outpatient if indicated.  Her repeat chest x-ray shows much improvement in pulmonary edema.  It also shows some consolidation in the left lower lobe.  She was advised to use incentive spirometer.  She was empirically started on doxycycline for possible pneumonia.  Sepsis has been ruled out with negative blood cultures and no source.   3. Arrhythmia: Continue Eliquis  4. Recent pacemaker status  5. Severe protein calorie malnutrition: Continue nutritional supplements as recommended by dietitian.  Patient would benefit from outpatient palliative care services upon discharge Management plans discussed with the patient and family and they are in agreement.  CODE STATUS: DNR  TOTAL TIME TAKING CARE OF THIS PATIENT: 25 minutes.     POSSIBLE D/C 1 to 2 days, DEPENDING ON CLINICAL CONDITION.   Misty Trevino M.D on 04/18/2018 at 9:41 AM  Between 7am to 6pm - Pager - [phone] After 6pm go to [email or web address] - password [email or web address] [facility]  Office  [phone]  CC: Primary care physician; Albina Billet, MD  Note: This dictation was prepared with Dragon dictation along with smaller phrase technology. Any transcriptional errors that result from this process are unintentional.

## 2018-04-19 LAB — GLUCOSE, CAPILLARY
Glucose-Capillary: 184 mg/dL — ABNORMAL HIGH (ref 70–99)
Glucose-Capillary: 227 mg/dL — ABNORMAL HIGH (ref 70–99)
Glucose-Capillary: 205 mg/dL — ABNORMAL HIGH (ref 70–99)
Glucose-Capillary: 211 mg/dL — ABNORMAL HIGH (ref 70–99)

## 2018-04-19 LAB — BASIC METABOLIC PANEL
Anion gap: 9 (ref 5–15)
BUN: 23 mg/dL (ref 8–23)
CALCIUM: 8.7 mg/dL — AB (ref 8.9–10.3)
CHLORIDE: 97 mmol/L — AB (ref 98–111)
CO2: 31 mmol/L (ref 22–32)
CREATININE: 0.79 mg/dL (ref 0.44–1.00)
Glucose, Bld: 161 mg/dL — ABNORMAL HIGH (ref 70–99)
Potassium: 3.6 mmol/L (ref 3.5–5.1)
Sodium: 137 mmol/L (ref 135–145)

## 2018-04-19 NOTE — Clinical Social Work Note (Signed)
CSW has contacted the admissions coordinator for [facility] about pending [facility] authorization. The authorization is still pending. CSW will update as more information available.  Santiago Bumpers, MSW, Latanya Presser [phone]

## 2018-04-19 NOTE — Progress Notes (Signed)
[facility] at [place] NAME: Misty Trevino    MR#:  [id number]  DATE OF BIRTH:  October 05, 1920  SUBJECTIVE:   We are waiting for insurance authorization.  No acute events overnight.  Patient is doing well.  REVIEW OF SYSTEMS:    Review of Systems  Constitutional: Negative for fever, chills weight loss HENT: Negative for ear pain, nosebleeds, congestion, facial swelling, rhinorrhea, neck pain, neck stiffness and ear discharge.   Respiratory: Positive for cough, no shortness of breath, no wheezing  Cardiovascular: Negative for chest pain, palpitations and leg swelling.  Gastrointestinal: Negative for heartburn, abdominal pain, vomiting, diarrhea or consitpation Genitourinary: Negative for dysuria, urgency, frequency, hematuria Musculoskeletal: Negative for back pain or joint pain Neurological: Negative for dizziness, seizures, syncope, focal weakness,  numbness and headaches.  Hematological: Does not bruise/bleed easily.  Psychiatric/Behavioral: Negative for hallucinations, confusion, dysphoric mood    Tolerating Diet: yes      DRUG ALLERGIES:  No Known Allergies  VITALS:  Blood pressure (!) 133/53, pulse 64, temperature 97.8 F (36.6 C), temperature source Oral, resp. rate 16, height 5\' 4"  (1.626 m), weight 50.3 kg, SpO2 97 %.  PHYSICAL EXAMINATION:  Constitutional: Appears well-developed and well-nourished. No distress. HENT: Normocephalic. Marland Kitchen Oropharynx is clear and moist.  Eyes: Conjunctivae and EOM are normal. PERRLA, no scleral icterus.  Neck: Normal ROM. Neck supple. No JVD. No tracheal deviation. CVS: RRR, S1/S2 +, no murmurs, no gallops, no carotid bruit.  Pulmonary: Bilateral basilar crackles without wheezing or rhonchi  abdominal: Soft. BS +,  no distension, tenderness, rebound or guarding.  Musculoskeletal: Normal range of motion. No edema and no tenderness.  Neuro: Alert. CN 2-12 grossly intact. No focal deficits. Skin:  Skin is warm and dry. No rash noted. Psychiatric: Normal mood and affect.      LABORATORY PANEL:   CBC Recent Labs  Lab 04/17/18 0435  WBC 14.6*  HGB 10.7*  HCT 32.5*  PLT 384   ------------------------------------------------------------------------------------------------------------------  Chemistries  Recent Labs  Lab 04/16/18 0451  04/19/18 0450  NA 137   < > 137  K 3.8   < > 3.6  CL 100   < > 97*  CO2 28   < > 31  GLUCOSE 178*   < > 161*  BUN 21   < > 23  CREATININE 0.76   < > 0.79  CALCIUM 8.3*   < > 8.7*  MG 1.8  --   --    < > = values in this interval not displayed.   ------------------------------------------------------------------------------------------------------------------  Cardiac Enzymes Recent Labs  Lab 04/12/18 1602  TROPONINI <0.03   ------------------------------------------------------------------------------------------------------------------  RADIOLOGY:  No results found.   ASSESSMENT AND PLAN:   84 year old female with a history of sick sinus syndrome status post recent pacemaker, diabetes and PAF who presented to the hospital due to chest pain.  1. Chest pain: Patient was evaluated by cardiology. She has been ruled out for ACS with negative troponins.   2. Acute hypoxic respiratory failure in the setting of acute systolic heart failure with ejection fraction of 35 to 40%:  Continue IV Lasix Monitor intake and output with daily weight  Empiric doxycycline for pneumonia (presumed)   sepsis has been ruled out with negative blood cultures and no source.   3. Arrhythmia: Continue Eliquis  4. Recent pacemaker status  5. Severe protein calorie malnutrition: Continue nutritional supplements as recommended by dietitian.  Patient would benefit from outpatient palliative care services  upon discharge Management plans discussed with the patient and family and they are in agreement.  CODE STATUS: DNR  TOTAL  TIME TAKING CARE OF THIS PATIENT: 24 minutes.   Waiting for insurance authorization  POSSIBLE D/C today  [name] M.D on 04/19/2018 at 10:22 AM  Between 7am to 6pm - Pager - [phone] After 6pm go to [email or web address] - password [email or web address] [facility]  Office  [phone]  CC: Primary care physician; Albina Billet, MD  Note: This dictation was prepared with Dragon dictation along with smaller phrase technology. Any transcriptional errors that result from this process are unintentional.

## 2018-04-20 LAB — CBC
HCT: 33.2 % — ABNORMAL LOW (ref 36.0–46.0)
Hemoglobin: 11.1 g/dL — ABNORMAL LOW (ref 12.0–15.0)
MCH: 31.6 pg (ref 26.0–34.0)
MCHC: 33.4 g/dL (ref 30.0–36.0)
MCV: 94.6 fL (ref 80.0–100.0)
Platelets: 495 10*3/uL — ABNORMAL HIGH (ref 150–400)
RBC: 3.51 MIL/uL — AB (ref 3.87–5.11)
RDW: 13.2 % (ref 11.5–15.5)
WBC: 13.9 10*3/uL — ABNORMAL HIGH (ref 4.0–10.5)
nRBC: 0 % (ref 0.0–0.2)

## 2018-04-20 LAB — GLUCOSE, CAPILLARY
Glucose-Capillary: 152 mg/dL — ABNORMAL HIGH (ref 70–99)
Glucose-Capillary: 289 mg/dL — ABNORMAL HIGH (ref 70–99)

## 2018-04-20 NOTE — Clinical Social Work Placement (Signed)
   CLINICAL SOCIAL WORK PLACEMENT  NOTE  Date:  04/20/2018  Patient Details  Name: Misty Trevino MRN: [id number] Date of Birth: [birth year]  Clinical Social Work is seeking post-discharge placement for this patient at the [facility] level of care (*CSW will initial, date and re-position this form in  chart as items are completed):  Yes   Patient/family provided with [facility] Work Department's list of facilities offering this level of care within the geographic area requested by the patient (or if unable, by the patient's family).  Yes   Patient/family informed of their freedom to choose among providers that offer the needed level of care, that participate in Medicare, Medicaid or managed care program needed by the patient, have an available bed and are willing to accept the patient.  Yes   Patient/family informed of [facility]'s ownership interest in [facility] and [facility], as well as of the fact that they are under no obligation to receive care at these facilities.  PASRR submitted to EDS on 04/17/18     PASRR number received on 04/17/18     Existing PASRR number confirmed on       FL2 transmitted to all facilities in geographic area requested by pt/family on 04/17/18     FL2 transmitted to all facilities within larger geographic area on       Patient informed that his/her managed care company has contracts with or will negotiate with certain facilities, including the following:        Yes   Patient/family informed of bed offers received.  Patient chooses bed at [facility]     Physician recommends and patient chooses bed at      Patient to be transferred to [facility] on 04/20/18.  Patient to be transferred to facility by [facility]     Patient family notified on 04/20/18 of transfer.  Name of family member notified:  Jan patient's daughter [phone]     PHYSICIAN Please sign FL2,  Please sign DNR     Additional Comment:    _______________________________________________ Ross Ludwig, LCSWA 04/20/2018, 3:09 PM

## 2018-04-20 NOTE — Care Management (Signed)
Pending authorization for SNF placement to [facility].  Avoidable days updated.

## 2018-04-20 NOTE — Care Management Important Message (Signed)
Copy of signed IM left with patient in room.  

## 2018-04-20 NOTE — Progress Notes (Signed)
Pt to be discharged to [facility] this afternoon. Iv and tele removed. Report called to the facility. Transport will be provided by e.m.s.

## 2018-04-20 NOTE — Progress Notes (Signed)
Notified by CSW Evette Cristal that patient was discharging to [facility] today. Discharge summary faxed to referral. Flo Shanks RN, BSN, [facility], hospital Liaison [phone]

## 2018-04-20 NOTE — Progress Notes (Signed)
Occupational Therapy Treatment Patient Details Name: Misty Trevino MRN: [id number] DOB: October 10, 1920 Today's Date: 04/20/2018    History of present illness Pt is a 84 y.o. female presenting to hospital 04/12/18 with generalized chest pain with radiation to L arm and neck, weakness, and SOB.  Pt admitted with chest pain, sepsis, leukocytosis, and acute respiratory failure with hypoxia d/t acute CHF.  PMH includes heart block s/p pacemaker 02/25/18, DM, dyspnea, edema, dysrhythmia, htn, R shoulder arthroscopy x2, stroke, SSS, h/o orthopnea, back surgery, eye surgery.  Pt also with thin skin and bruises easily.   OT comments  Pt seen for OT tx this date focused on self care skills and functional mobility for ADL tasks. Pt agreeable, denies pain, and eager to participate. Pt able to perform bed mobility with MIN assist this date and minimal cues to better position herself in bed. Pt tolerated sitting EOB and able to don socks with CGA but endorses feeling a little tired after bending down her to feet to don. Pt instructed in modified techniques to minimize bending with pt able to return demo use of good technique. Pt endorsed new technique was helpful and made it "easier." During functional transfer training with RW pt instructed in hand/foot placement and scooting EOB to improve safety/indep of transfer in preparation for other LB dressing tasks. Pt required MIN assist for lift off and CGA once standing for balance with slight posterior lean which pt was able to correct with verbal cues to lean forward slightly and keep hands on the RW. Pt then became incontinent of urine. Nurse tech came to assist with clothing mgt and hygiene while pt was in standing to minimize LOB. Linens and gown changed as well. HR and O2 sats stable t/o session including during functional tasks. No SOB noted. Pt progressing well towards OT goals. Pt eager to return to PLOF and eventually return home to her cat. Will continue to  progress. STR remains most appropriate to facilitate safe return home.   Follow Up Recommendations  SNF    Equipment Recommendations  Other (comment)(pt would benefit from using reacher and sock aid and elastic shoe laces)    Recommendations for Other Services      Precautions / Restrictions Precautions Precautions: Fall Restrictions Weight Bearing Restrictions: No       Mobility Bed Mobility Overal bed mobility: Needs Assistance Bed Mobility: Supine to Sit;Sit to Supine     Supine to sit: HOB elevated;Min assist Sit to supine: Min assist   General bed mobility comments: + verbal cues to better align her hips once back in bed  Transfers Overall transfer level: Needs assistance Equipment used: Rolling walker (2 wheeled) Transfers: Sit to/from Stand Sit to Stand: Min assist;From elevated surface         General transfer comment: pt instructed in hand/foot placement and scooting EOB to improve safety and indep with t/f with RW. Pt required MIN assist with slightly elevated bed to mimic home environment and cues to utilize learned techniques    Balance Overall balance assessment: Needs assistance Sitting-balance support: Feet supported;Single extremity supported Sitting balance-Leahy Scale: Fair     Standing balance support: Bilateral upper extremity supported;During functional activity Standing balance-Leahy Scale: Fair                             ADL either performed or assessed with clinical judgement   ADL Overall ADL's : Needs assistance/impaired  Upper Body Dressing : Sitting;Set up Upper Body Dressing Details (indicate cue type and reason): pt able to doff and don clean gown seated EOB with setup and mgt of telemetry box Lower Body Dressing: Sitting/lateral leans;Min guard Lower Body Dressing Details (indicate cue type and reason): sitting EOB, pt able to don socks by bending over with CGA to reach feet and able to pull  them up without physical assist (aside from CGA); pt demo'd improve technique and ease of donning when she crossed one leg over the other to "bring her foot up to her" which also minimized SOB/holding her breath and pt verbalizing that it was "much easier" with modified technique.      Toileting- Clothing Manipulation and Hygiene: Sit to/from stand;Maximal assistance;+2 for physical assistance Toileting - Clothing Manipulation Details (indicate cue type and reason): During standing trials, pt became incontinent, requiring CGA-MIN assist for transitional movements with RW for balance and nurse tech providing max assist for clothing mgt and hygiene to minimize LOB.      Functional mobility during ADLs: Min guard;Rolling walker       Vision Baseline Vision/History: Wears glasses Wears Glasses: At all times Patient Visual Report: No change from baseline     Perception     Praxis      Cognition Arousal/Alertness: Awake/alert Behavior During Therapy: WFL for tasks assessed/performed Overall Cognitive Status: Within Functional Limits for tasks assessed                                          Exercises Other Exercises Other Exercises: pt instructed in positioning and modified techniques to improve safety and independence with LB dressing tasks   Shoulder Instructions       General Comments HR and O2 sats stable t/o session with 2L O2 t/o.    Pertinent Vitals/ Pain       Pain Assessment: No/denies pain Pain Intervention(s): Monitored during session  Home Living                                          Prior Functioning/Environment              Frequency  Min 1X/week        Progress Toward Goals  OT Goals(current goals can now be found in the care plan section)  Progress towards OT goals: Progressing toward goals  Acute Rehab OT Goals Patient Stated Goal: "to be able to care for myself like I was" OT Goal Formulation: With  patient/family Time For Goal Achievement: 05/01/18 Potential to Achieve Goals: Good  Plan Discharge plan remains appropriate;Frequency remains appropriate    Co-evaluation                 AM-PAC PT "6 Clicks" Daily Activity     Outcome Measure   Help from another person eating meals?: A Little Help from another person taking care of personal grooming?: A Little Help from another person toileting, which includes using toliet, bedpan, or urinal?: A Lot Help from another person bathing (including washing, rinsing, drying)?: A Lot Help from another person to put on and taking off regular upper body clothing?: A Little Help from another person to put on and taking off regular lower body clothing?: A Little 6 Click Score: 16  End of Session Equipment Utilized During Treatment: Gait belt;Rolling walker;Oxygen(2L O2)  OT Visit Diagnosis: Unsteadiness on feet (R26.81);History of falling (Z91.81);Muscle weakness (generalized) (M62.81)   Activity Tolerance Patient tolerated treatment well   Patient Left in bed;with call bell/phone within reach;with bed alarm set   Nurse Communication          Time: 1031-5945 OT Time Calculation (min): 53 min  Charges: OT General Charges $OT Visit: 1 Visit OT Treatments $Self Care/Home Management : 53-67 mins  Jeni Salles, MPH, MS, OTR/L ascom [phone] 04/20/18, 12:31 PM

## 2018-04-20 NOTE — Clinical Social Work Note (Addendum)
CSW was contacted by [facility], and they have not received insurance auth yet, insurance Josem Kaufmann is still pending.  CSW updated patient's daughter, bedside nurse, and physician.    2:10pm CSW received phone call from [facility] and they have received insurance authorization for patient to go today.  CSW attempted to call patient's daughter Jan [phone], and left message on voice mail to inform her that insurance has been approved.  Patient to be d/c'ed today to [facility] room 702.  Patient and family agreeable to plans will transport via ems RN to call report [phone].  CSW received phone call back from patient's daughter Jan, [phone] and she is aware that patient will be discharging to [facility] today.  Palliative will be following patient, [place] and [place] is aware of referral.  Jones Broom. Norval Morton, MSW, [place]  04/20/2018 12:34 PM

## 2018-04-21 LAB — CULTURE, BLOOD (ROUTINE X 2)
Culture: NO GROWTH
Culture: NO GROWTH

## 2018-04-23 ENCOUNTER — Other Ambulatory Visit
Admission: RE | Admit: 2018-04-23 | Discharge: 2018-04-23 | Disposition: A | Discharge status: Home / Self Care | Payer: Medicare Other | Source: Ambulatory Visit | Attending: Family Medicine | Admitting: Family Medicine

## 2018-04-23 DIAGNOSIS — C153 Malignant neoplasm of upper third of esophagus: Secondary | ICD-10-CM | POA: Insufficient documentation

## 2018-04-23 LAB — BRAIN NATRIURETIC PEPTIDE: B NATRIURETIC PEPTIDE 5: 228 pg/mL — AB (ref 0.0–100.0)

## 2018-04-30 ENCOUNTER — Ambulatory Visit: Payer: Medicare Other | Admitting: Family

## 2018-05-11 ENCOUNTER — Ambulatory Visit: Payer: Medicare Other | Admitting: Family

## 2018-09-25 ENCOUNTER — Other Ambulatory Visit: Payer: Self-pay | Admitting: Internal Medicine

## 2018-09-25 ENCOUNTER — Other Ambulatory Visit (HOSPITAL_COMMUNITY): Payer: Self-pay | Admitting: Internal Medicine

## 2018-09-25 ENCOUNTER — Ambulatory Visit
Admission: RE | Admit: 2018-09-25 | Discharge: 2018-09-25 | Disposition: A | Discharge status: Home / Self Care | Payer: Medicare Other | Source: Ambulatory Visit | Attending: Internal Medicine | Admitting: Internal Medicine

## 2018-09-25 DIAGNOSIS — R443 Hallucinations, unspecified: Secondary | ICD-10-CM | POA: Diagnosis present

## 2018-09-25 DIAGNOSIS — M6281 Muscle weakness (generalized): Secondary | ICD-10-CM

## 2018-10-16 ENCOUNTER — Other Ambulatory Visit: Payer: Self-pay

## 2018-10-16 ENCOUNTER — Inpatient Hospital Stay
Admission: EM | Admit: 2018-10-16 | Discharge: 2018-11-06 | DRG: 682 | Disposition: E | Payer: Medicare Other | Attending: Internal Medicine | Admitting: Internal Medicine

## 2018-10-16 ENCOUNTER — Emergency Department: Payer: Medicare Other

## 2018-10-16 ENCOUNTER — Observation Stay: Payer: Medicare Other

## 2018-10-16 ENCOUNTER — Observation Stay: Admit: 2018-10-16 | Payer: Medicare Other

## 2018-10-16 DIAGNOSIS — Z950 Presence of cardiac pacemaker: Secondary | ICD-10-CM

## 2018-10-16 DIAGNOSIS — K219 Gastro-esophageal reflux disease without esophagitis: Secondary | ICD-10-CM | POA: Diagnosis present

## 2018-10-16 DIAGNOSIS — Z8673 Personal history of transient ischemic attack (TIA), and cerebral infarction without residual deficits: Secondary | ICD-10-CM

## 2018-10-16 DIAGNOSIS — R4182 Altered mental status, unspecified: Secondary | ICD-10-CM | POA: Diagnosis present

## 2018-10-16 DIAGNOSIS — S32018A Other fracture of first lumbar vertebra, initial encounter for closed fracture: Secondary | ICD-10-CM | POA: Diagnosis present

## 2018-10-16 DIAGNOSIS — N179 Acute kidney failure, unspecified: Principal | ICD-10-CM

## 2018-10-16 DIAGNOSIS — I110 Hypertensive heart disease with heart failure: Secondary | ICD-10-CM | POA: Diagnosis present

## 2018-10-16 DIAGNOSIS — E119 Type 2 diabetes mellitus without complications: Secondary | ICD-10-CM | POA: Diagnosis present

## 2018-10-16 DIAGNOSIS — R0602 Shortness of breath: Secondary | ICD-10-CM

## 2018-10-16 DIAGNOSIS — I272 Pulmonary hypertension, unspecified: Secondary | ICD-10-CM | POA: Diagnosis present

## 2018-10-16 DIAGNOSIS — I495 Sick sinus syndrome: Secondary | ICD-10-CM | POA: Diagnosis present

## 2018-10-16 DIAGNOSIS — Z7984 Long term (current) use of oral hypoglycemic drugs: Secondary | ICD-10-CM

## 2018-10-16 DIAGNOSIS — I5023 Acute on chronic systolic (congestive) heart failure: Secondary | ICD-10-CM | POA: Diagnosis present

## 2018-10-16 DIAGNOSIS — W19XXXA Unspecified fall, initial encounter: Secondary | ICD-10-CM

## 2018-10-16 DIAGNOSIS — Z79899 Other long term (current) drug therapy: Secondary | ICD-10-CM

## 2018-10-16 DIAGNOSIS — F0390 Unspecified dementia without behavioral disturbance: Secondary | ICD-10-CM | POA: Diagnosis present

## 2018-10-16 DIAGNOSIS — Z515 Encounter for palliative care: Secondary | ICD-10-CM | POA: Diagnosis not present

## 2018-10-16 DIAGNOSIS — I480 Paroxysmal atrial fibrillation: Secondary | ICD-10-CM | POA: Diagnosis present

## 2018-10-16 DIAGNOSIS — Z79890 Hormone replacement therapy (postmenopausal): Secondary | ICD-10-CM

## 2018-10-16 DIAGNOSIS — J9601 Acute respiratory failure with hypoxia: Secondary | ICD-10-CM | POA: Diagnosis present

## 2018-10-16 DIAGNOSIS — Z7901 Long term (current) use of anticoagulants: Secondary | ICD-10-CM

## 2018-10-16 DIAGNOSIS — E875 Hyperkalemia: Secondary | ICD-10-CM | POA: Diagnosis present

## 2018-10-16 DIAGNOSIS — Z66 Do not resuscitate: Secondary | ICD-10-CM | POA: Diagnosis present

## 2018-10-16 LAB — CBC WITH DIFFERENTIAL/PLATELET
Abs Immature Granulocytes: 0.16 10*3/uL — ABNORMAL HIGH (ref 0.00–0.07)
Basophils Absolute: 0.1 10*3/uL (ref 0.0–0.1)
Basophils Relative: 1 %
Eosinophils Absolute: 0.2 10*3/uL (ref 0.0–0.5)
Eosinophils Relative: 1 %
HCT: 45 % (ref 36.0–46.0)
Hemoglobin: 15.3 g/dL — ABNORMAL HIGH (ref 12.0–15.0)
Immature Granulocytes: 2 %
Lymphocytes Relative: 17 %
Lymphs Abs: 1.9 10*3/uL (ref 0.7–4.0)
MCH: 32.8 pg (ref 26.0–34.0)
MCHC: 34 g/dL (ref 30.0–36.0)
MCV: 96.6 fL (ref 80.0–100.0)
Monocytes Absolute: 1 10*3/uL (ref 0.1–1.0)
Monocytes Relative: 9 %
Neutro Abs: 7.7 10*3/uL (ref 1.7–7.7)
Neutrophils Relative %: 70 %
Platelets: 270 10*3/uL (ref 150–400)
RBC: 4.66 MIL/uL (ref 3.87–5.11)
RDW: 21.2 % — ABNORMAL HIGH (ref 11.5–15.5)
Smear Review: NORMAL
WBC: 11 10*3/uL — ABNORMAL HIGH (ref 4.0–10.5)
nRBC: 0 % (ref 0.0–0.2)

## 2018-10-16 LAB — TROPONIN I
Troponin I: 0.04 ng/mL (ref <0.03)
Troponin I: 0.03 ng/mL (ref <0.03)
Troponin I: 0.03 ng/mL (ref <0.03)

## 2018-10-16 LAB — URINALYSIS, COMPLETE (UACMP) WITH MICROSCOPIC
Bacteria, UA: NONE SEEN
Bilirubin Urine: NEGATIVE
Glucose, UA: NEGATIVE mg/dL
Hgb urine dipstick: NEGATIVE
Ketones, ur: NEGATIVE mg/dL
Leukocytes,Ua: NEGATIVE
Nitrite: NEGATIVE
Protein, ur: 30 mg/dL — AB
Specific Gravity, Urine: 1.009 (ref 1.005–1.030)
pH: 5 (ref 5.0–8.0)

## 2018-10-16 LAB — COMPREHENSIVE METABOLIC PANEL
ALT: 22 U/L (ref 0–44)
AST: 26 U/L (ref 15–41)
Albumin: 3.9 g/dL (ref 3.5–5.0)
Alkaline Phosphatase: 96 U/L (ref 38–126)
Anion gap: 13 (ref 5–15)
BUN: 29 mg/dL — ABNORMAL HIGH (ref 8–23)
CO2: 22 mmol/L (ref 22–32)
Calcium: 9.3 mg/dL (ref 8.9–10.3)
Chloride: 105 mmol/L (ref 98–111)
Creatinine, Ser: 1.3 mg/dL — ABNORMAL HIGH (ref 0.44–1.00)
GFR calc Af Amer: 40 mL/min — ABNORMAL LOW (ref >60)
GFR calc non Af Amer: 35 mL/min — ABNORMAL LOW (ref >60)
Glucose, Bld: 164 mg/dL — ABNORMAL HIGH (ref 70–99)
Potassium: 4.1 mmol/L (ref 3.5–5.1)
Sodium: 140 mmol/L (ref 135–145)
Total Bilirubin: 1.1 mg/dL (ref 0.3–1.2)
Total Protein: 7 g/dL (ref 6.5–8.1)

## 2018-10-16 MED ORDER — GLUCERNA SHAKE PO LIQD
237.0000 mL | Freq: Three times a day (TID) | ORAL | Status: DC
  Administered 2018-10-16 – 2018-10-18 (×6): 237 mL via ORAL

## 2018-10-16 MED ORDER — CARVEDILOL 3.125 MG PO TABS
3.1250 mg | ORAL_TABLET | Freq: Two times a day (BID) | ORAL | Status: DC
  Administered 2018-10-16 – 2018-10-18 (×4): 3.125 mg via ORAL
  Filled 2018-10-16 (×4): qty 1

## 2018-10-16 MED ORDER — SODIUM CHLORIDE 0.9 % IV SOLN
Freq: Once | INTRAVENOUS | Status: AC
  Administered 2018-10-16: 11:00:00 via INTRAVENOUS

## 2018-10-16 MED ORDER — VITAMIN D3 25 MCG (1000 UNIT) PO TABS
1000.0000 [IU] | ORAL_TABLET | Freq: Every day | ORAL | Status: DC
  Administered 2018-10-16 – 2018-10-17 (×2): 1000 [IU] via ORAL
  Filled 2018-10-16 (×4): qty 1

## 2018-10-16 MED ORDER — QUETIAPINE FUMARATE 25 MG PO TABS
25.0000 mg | ORAL_TABLET | Freq: Once | ORAL | Status: AC
  Administered 2018-10-16: 25 mg via ORAL
  Filled 2018-10-16: qty 1

## 2018-10-16 MED ORDER — ONDANSETRON HCL 4 MG PO TABS: 4.0000 mg | ORAL_TABLET | Freq: Four times a day (QID) | ORAL | Status: DC | PRN

## 2018-10-16 MED ORDER — ACETAMINOPHEN 650 MG RE SUPP: 650.0000 mg | Freq: Four times a day (QID) | RECTAL | Status: DC | PRN

## 2018-10-16 MED ORDER — POLYVINYL ALCOHOL 1.4 % OP SOLN
1.0000 [drp] | Freq: Every evening | OPHTHALMIC | Status: DC
  Administered 2018-10-17: 1 [drp] via OPHTHALMIC
  Filled 2018-10-16 (×3): qty 15

## 2018-10-16 MED ORDER — ALLOPURINOL 100 MG PO TABS
100.0000 mg | ORAL_TABLET | Freq: Every evening | ORAL | Status: DC
  Administered 2018-10-16 – 2018-10-17 (×2): 100 mg via ORAL
  Filled 2018-10-16 (×3): qty 1

## 2018-10-16 MED ORDER — APIXABAN 2.5 MG PO TABS
2.5000 mg | ORAL_TABLET | Freq: Two times a day (BID) | ORAL | Status: DC
  Administered 2018-10-16 – 2018-10-18 (×5): 2.5 mg via ORAL
  Filled 2018-10-16 (×5): qty 1

## 2018-10-16 MED ORDER — POLYETHYLENE GLYCOL 3350 17 G PO PACK: 17.0000 g | PACK | Freq: Every day | ORAL | Status: DC | PRN

## 2018-10-16 MED ORDER — ACETAMINOPHEN 325 MG PO TABS
650.0000 mg | ORAL_TABLET | Freq: Four times a day (QID) | ORAL | Status: DC | PRN
  Administered 2018-10-16 – 2018-10-18 (×4): 650 mg via ORAL
  Filled 2018-10-16 (×6): qty 2

## 2018-10-16 MED ORDER — ONDANSETRON HCL 4 MG/2ML IJ SOLN
4.0000 mg | Freq: Four times a day (QID) | INTRAMUSCULAR | Status: DC | PRN
  Administered 2018-10-18: 22:00:00 4 mg via INTRAVENOUS
  Filled 2018-10-16: qty 2

## 2018-10-16 NOTE — ED Notes (Signed)
Repositioned pt in bed. Checked pts brief and pt was dry.

## 2018-10-16 NOTE — ED Notes (Signed)
Report given to Lindsay RN

## 2018-10-16 NOTE — ED Notes (Signed)
Daughter Marisue Humble [phone] Son-in-law Enos Fling [phone]

## 2018-10-16 NOTE — ED Notes (Signed)
Pt transported to room 252 by this tech.v Pts daughter called to inform of room change.

## 2018-10-16 NOTE — H&P (Addendum)
[place] [facility] [place] NAME: Misty Trevino    MR#:  [id number]  DATE OF BIRTH:  June 24, 1921  DATE OF ADMISSION:  10/07/2018  PRIMARY CARE PHYSICIAN: Albina Billet, MD   REQUESTING/REFERRING PHYSICIAN: Charlotte Crumb, MD  CHIEF COMPLAINT:   Chief Complaint  Patient presents with  . Fall  . Back Pain    HISTORY OF PRESENT ILLNESS:  Misty Trevino  is a 85 y.o. female with a known history of hypertension, type 2 diabetes, history of heart block, history of TIA who presented to the ED after an unwitnessed fall at home.  She does not quite remember why she fell.  She denies any dizziness or lightheadedness.  She denies any chest pain, shortness of breath, palpitations, lower extremity edema.  She denies any headache or vision changes.  She endorses some constant, severe low back pain.  She denies any pain shooting down her legs.   In the ED, she had some desaturations to 87% on room air, and was placed on 2 L O2 by nasal cannula.  Labs are significant for creatinine 1.30, WBC 11, hemoglobin 15.3.  CT head and C-spine did not show any acute abnormalities.  She did have some moderate to moderately large bilateral pleural effusions that were seen on that CT.  Chest x-ray showed pulmonary vascular congestion and small bilateral pleural effusions.  Hospitalists were called for admission.  PAST MEDICAL HISTORY:   Past Medical History:  Diagnosis Date  . Diabetes mellitus without complication ([place])   . Dyspnea    chronic doe  . Dysrhythmia    afib / block  . Edema    pedal  . GERD (gastroesophageal reflux disease)   . History of orthopnea   . Hypertension   . Sick sinus syndrome ([place])   . Stroke ([place])    tia 2 years ago    PAST SURGICAL HISTORY:   Past Surgical History:  Procedure Laterality Date  . BACK SURGERY     disc repair? unsure  . EYE SURGERY Left   . PACEMAKER INSERTION N/A 02/25/2018   Procedure: INSERTION PACEMAKER- INITIAL  DUAL CHAMBER;  Surgeon: [name], MD;  Location: [facility] ORS;  Service: Cardiovascular;  Laterality: N/A;  . SHOULDER ARTHROSCOPY Right    x2    SOCIAL HISTORY:   Social History   Tobacco Use  . Smoking status: Never Smoker  . Smokeless tobacco: Never Used  Substance Use Topics  . Alcohol use: Never    Frequency: Never    FAMILY HISTORY:  Misty family history on file.  DRUG ALLERGIES:  Misty Known Allergies  REVIEW OF SYSTEMS:   Review of Systems  Constitutional: Negative for chills and fever.  HENT: Negative for congestion and sore throat.   Eyes: Negative for blurred vision and double vision.  Respiratory: Negative for cough, shortness of breath and wheezing.   Cardiovascular: Negative for chest pain, palpitations and leg swelling.  Gastrointestinal: Negative for abdominal pain, nausea and vomiting.  Genitourinary: Negative for dysuria and urgency.  Musculoskeletal: Positive for back pain and falls. Negative for neck pain.  Neurological: Negative for dizziness and headaches.  Psychiatric/Behavioral: Negative for depression. The patient is not nervous/anxious.     MEDICATIONS AT HOME:   Prior to Admission medications   Medication Sig Start Date End Date Taking? Authorizing Provider  allopurinol (ZYLOPRIM) 100 MG tablet Take 100 mg by mouth every evening.    [provider]  apixaban Arne [place]) 2.5  MG TABS tablet Take 2.5 mg by mouth 2 (two) times daily.    [provider]  carboxymethylcellulose (REFRESH PLUS) 0.5 % SOLN Place 1 drop into both eyes every evening.    [provider]  carvedilol (COREG) 3.125 MG tablet Take 1 tablet (3.125 mg total) by mouth 2 (two) times daily with a meal. 04/17/18   Bettey Costa, MD  cholecalciferol (VITAMIN D) 1000 units tablet Take 1,000 Units by mouth daily.    [provider]  feeding supplement, GLUCERNA SHAKE, (GLUCERNA SHAKE) LIQD Take 237 mLs by mouth 3 (three) times daily between meals.  04/17/18   Bettey Costa, MD  furosemide (LASIX) 40 MG tablet Take 1 tablet (40 mg total) by mouth 2 (two) times daily. 04/17/18   Bettey Costa, MD  lisinopril (PRINIVIL,ZESTRIL) 2.5 MG tablet Take 1 tablet (2.5 mg total) by mouth daily. 04/18/18   Bettey Costa, MD  metFORMIN (GLUCOPHAGE) 850 MG tablet Take 850 mg by mouth daily with breakfast.    [provider]  Multiple Vitamins-Minerals (PRESERVISION AREDS 2 PO) Take 1 capsule by mouth 2 (two) times daily.    [provider]  triamcinolone cream (KENALOG) 0.1 % Apply 1 application topically at bedtime.    [provider]      VITAL SIGNS:  Blood pressure 126/84, pulse 65, temperature (!) 97.5 F (36.4 C), temperature source Oral, resp. rate 14, height 5\' 6"  (1.676 m), weight 54.4 kg, SpO2 91 %.  PHYSICAL EXAMINATION:  Physical Exam  GENERAL:  85 y.o.-year-old patient lying in the bed with Misty acute distress.  Thin-appearing. EYES: Pupils equal, round, reactive to light and accommodation. Misty scleral icterus. Extraocular muscles intact.  HEENT: Head atraumatic, normocephalic. Oropharynx and nasopharynx clear. + Dry mucous membranes. NECK:  Supple, Misty jugular venous distention. Misty thyroid enlargement, Misty tenderness.  LUNGS: +faint bibasilar crackles. Misty use of accessory muscles of respiration. + Nasal cannula in place. CARDIOVASCULAR: RRR, S1, S2 normal. Misty murmurs, rubs, or gallops.  ABDOMEN: Soft, nontender, nondistended. Bowel sounds present. Misty organomegaly or mass.  EXTREMITIES: Misty cyanosis, or clubbing. + Mild bilateral edema NEUROLOGIC: Cranial nerves II through XII are intact. Muscle strength 5/5 in all extremities. Sensation intact. Gait not checked.  PSYCHIATRIC: The patient is alert and oriented x 2. SKIN: Misty obvious rash, lesion, or ulcer.   LABORATORY PANEL:   CBC Recent Labs  Lab 10/07/2018 0811  WBC 11.0*  HGB 15.3*  HCT 45.0  PLT 270    ------------------------------------------------------------------------------------------------------------------  Chemistries  Recent Labs  Lab 10/25/2018 0811  NA 140  K 4.1  CL 105  CO2 22  GLUCOSE 164*  BUN 29*  CREATININE 1.30*  CALCIUM 9.3  AST 26  ALT 22  ALKPHOS 96  BILITOT 1.1   ------------------------------------------------------------------------------------------------------------------  Cardiac Enzymes Recent Labs  Lab 10/23/2018 0811  TROPONINI 0.04*   ------------------------------------------------------------------------------------------------------------------  RADIOLOGY:  Dg Chest 2 View  Result Date: 10/09/2018 CLINICAL DATA:  Fall EXAM: CHEST - 2 VIEW COMPARISON:  04/16/2018 FINDINGS: Left subclavian pacemaker device is stable. The heart is mildly enlarged. Vascular congestion. Mild central basilar edema. Small pleural effusions. IMPRESSION: Mild CHF and small pleural effusions. Electronically Signed   By: Marybelle Killings M.D.   On: 11/01/2018 08:56   Ct Head Wo Contrast  Result Date: 10/07/2018 CLINICAL DATA:  Status post fall today with a blow to the neck. The patient is on anticoagulation therapy. Altered mental status. EXAM: CT HEAD WITHOUT CONTRAST CT CERVICAL SPINE WITHOUT CONTRAST TECHNIQUE: Multidetector  CT imaging of the head and cervical spine was performed following the standard protocol without intravenous contrast. Multiplanar CT image reconstructions of the cervical spine were also generated. COMPARISON:  Head CT scan 09/25/2018. FINDINGS: CT HEAD FINDINGS Brain: Misty evidence of acute infarction, hemorrhage, hydrocephalus, extra-axial collection or mass lesion/mass effect. Atrophy and chronic microvascular ischemic change noted. Vascular: Atherosclerosis. Skull: Intact.  Misty focal lesion. Sinuses/Orbits: Status post cataract extraction. Otherwise negative. Other: None. CT CERVICAL SPINE FINDINGS Alignment: Normal. Skull base and vertebrae: Misty acute  fracture. Misty primary bone lesion or focal pathologic process. Soft tissues and spinal canal: Misty prevertebral fluid or swelling. Misty visible canal hematoma. Extensive atherosclerosis noted. Disc levels:  Loss of disc space height is worst at C3-4 and C5-6. Upper chest: Moderate to moderately large bilateral pleural effusions are partially imaged. Other: None. IMPRESSION: Misty acute abnormality head or cervical spine. Atrophy and chronic microvascular ischemic change. Moderate to moderately large bilateral pleural effusions are partially visualized. Atherosclerosis. Electronically Signed   By: Inge Rise M.D.   On: 11/05/2018 08:43   Ct Cervical Spine Wo Contrast  Result Date: 10/10/2018 CLINICAL DATA:  Status post fall today with a blow to the neck. The patient is on anticoagulation therapy. Altered mental status. EXAM: CT HEAD WITHOUT CONTRAST CT CERVICAL SPINE WITHOUT CONTRAST TECHNIQUE: Multidetector CT imaging of the head and cervical spine was performed following the standard protocol without intravenous contrast. Multiplanar CT image reconstructions of the cervical spine were also generated. COMPARISON:  Head CT scan 09/25/2018. FINDINGS: CT HEAD FINDINGS Brain: Misty evidence of acute infarction, hemorrhage, hydrocephalus, extra-axial collection or mass lesion/mass effect. Atrophy and chronic microvascular ischemic change noted. Vascular: Atherosclerosis. Skull: Intact.  Misty focal lesion. Sinuses/Orbits: Status post cataract extraction. Otherwise negative. Other: None. CT CERVICAL SPINE FINDINGS Alignment: Normal. Skull base and vertebrae: Misty acute fracture. Misty primary bone lesion or focal pathologic process. Soft tissues and spinal canal: Misty prevertebral fluid or swelling. Misty visible canal hematoma. Extensive atherosclerosis noted. Disc levels:  Loss of disc space height is worst at C3-4 and C5-6. Upper chest: Moderate to moderately large bilateral pleural effusions are partially imaged. Other: None.  IMPRESSION: Misty acute abnormality head or cervical spine. Atrophy and chronic microvascular ischemic change. Moderate to moderately large bilateral pleural effusions are partially visualized. Atherosclerosis. Electronically Signed   By: Inge Rise M.D.   On: 11/02/2018 08:43      IMPRESSION AND PLAN:   Acute kidney injury- likely due to decreased p.o. intake and intravascular depletion.  -Hold Lasix and lisinopril -Hold on IV fluids given findings on chest x-ray.  Encourage p.o. intake. -Check renal ultrasound -Avoid nephrotoxic agents -Recheck creatinine in the morning  Unwitnessed fall- patient denies any syncope.  CT head and C-spine negative.  Patient complaining of low back pain. -PT consult -Check orthostatic vitals -Check lumbar x-ray  Acute hypoxic respiratory failure secondary to mild exacerbation of chronic systolic congestive heart failure- last EF 35 to 40% 10/19.  Chest x-ray with findings consistent with CHF. -Hold on IV fluids and Lasix for now -Check ECHO -Continue Coreg -Wean O2 as able -Cardiac monitoring  Mildly elevated troponin- likely demand ischemia. Misty active chest pain. -Trend troponin  Paroxysmal atrial fibrillation-patient in normal sinus rhythm here. Has  -Continue Eliquis and Coreg  Type 2 diabetes-blood sugars mildly elevated in the ED -Due to age, will hold off on frequent CBG checks and sliding scale insulin at this time -Monitor blood sugar with morning BMP  DVT prophylaxis-Eliquis  All the records are reviewed and case discussed with ED provider. Management plans discussed with the patient, family and they are in agreement.  CODE STATUS: DNR  TOTAL TIME TAKING CARE OF THIS PATIENT: 45 minutes.    Berna Spare Latonda Larrivee M.D on 10/10/2018 at 10:01 AM  Between 7am to 6pm - Pager - [phone]  After 6pm go to [email or web address] - Proofreader  [facility]  Office  [phone]  CC: Primary care physician;  Albina Billet, MD   Note: This dictation was prepared with Dragon dictation along with smaller phrase technology. Any transcriptional errors that result from this process are unintentional.

## 2018-10-16 NOTE — ED Triage Notes (Signed)
Pt arrived via ems for report of unwitnessed fall - pt unsure how she fell - pt is oriented to self - disoriented to place and time and states that she was "carrying a baby when she fell" trying to get her to the hospital - pt c/o lower back pain - pt hit neck on a coffee table when she fell - she is on Eliquis

## 2018-10-16 NOTE — ED Notes (Signed)
Repositioned pt to left side

## 2018-10-16 NOTE — ED Provider Notes (Addendum)
[facility] Emergency Department Provider Note  ____________________________________________   I have reviewed the triage vital signs and the nursing notes. Where available I have reviewed prior notes and, if possible and indicated, outside hospital notes.    HISTORY  Chief Complaint Fall and Back Pain    HPI Misty Trevino is a 85 y.o. female with history of chronic DOE, CHF, atrial fibrillation, on blood thinners with a pacemaker presents today complaining of a fall.  She came in from home with EMS, vital signs are stable for EMS sugar was reassuring.  Patient had a unwitnessed fall.  Baseline is not entirely clear.  She does have some evidence of dementia it appears.  In any event, we will verify that with family when they arrive.  Patient states she is not sure why she fell.  She knows she is in the emergency room she knows her name she is unsure of the exact year, she states she debated coming in because she does not have a lot of pain but she was worried that something might be injured.  Initially, she told EMS that she had some low back pain but she states that is gone now she could move in the bed without any discomfort.  No hip pain no other injury.  Does not think she passed out. Has loss of consciousness, does not know she hit her head.  Past Medical History:  Diagnosis Date  . Diabetes mellitus without complication ([place])   . Dyspnea    chronic doe  . Dysrhythmia    afib / block  . Edema    pedal  . GERD (gastroesophageal reflux disease)   . History of orthopnea   . Hypertension   . Sick sinus syndrome ([place])   . Stroke [facility])    tia 2 years ago    Patient Active Problem List   Diagnosis Date Noted  . Chest pain 04/12/2018  . Sepsis ([place]) 04/12/2018  . Mobitz type 2 second degree heart block 02/25/2018    Past Surgical History:  Procedure Laterality Date  . BACK SURGERY     disc repair? unsure  . EYE SURGERY Left   . PACEMAKER  INSERTION N/A 02/25/2018   Procedure: INSERTION PACEMAKER- INITIAL DUAL CHAMBER;  Surgeon: [name], MD;  Location: [facility] ORS;  Service: Cardiovascular;  Laterality: N/A;  . SHOULDER ARTHROSCOPY Right    x2    Prior to Admission medications   Medication Sig Start Date End Date Taking? Authorizing Provider  allopurinol (ZYLOPRIM) 100 MG tablet Take 100 mg by mouth every evening.    [provider]  apixaban (ELIQUIS) 2.5 MG TABS tablet Take 2.5 mg by mouth 2 (two) times daily.    [provider]  carboxymethylcellulose (REFRESH PLUS) 0.5 % SOLN Place 1 drop into both eyes every evening.    [provider]  carvedilol (COREG) 3.125 MG tablet Take 1 tablet (3.125 mg total) by mouth 2 (two) times daily with a meal. 04/17/18   Bettey Costa, MD  cholecalciferol (VITAMIN D) 1000 units tablet Take 1,000 Units by mouth daily.    [provider]  feeding supplement, GLUCERNA SHAKE, (GLUCERNA SHAKE) LIQD Take 237 mLs by mouth 3 (three) times daily between meals. 04/17/18   Bettey Costa, MD  furosemide (LASIX) 40 MG tablet Take 1 tablet (40 mg total) by mouth 2 (two) times daily. 04/17/18   Bettey Costa, MD  lisinopril (PRINIVIL,ZESTRIL) 2.5 MG tablet Take 1 tablet (2.5 mg total) by  mouth daily. 04/18/18   Bettey Costa, MD  metFORMIN (GLUCOPHAGE) 850 MG tablet Take 850 mg by mouth daily with breakfast.    [provider]  Multiple Vitamins-Minerals (PRESERVISION AREDS 2 PO) Take 1 capsule by mouth 2 (two) times daily.    [provider]  triamcinolone cream (KENALOG) 0.1 % Apply 1 application topically at bedtime.    [provider]    Allergies Patient has no known allergies.  No family history on file.  Social History Social History   Tobacco Use  . Smoking status: Never Smoker  . Smokeless tobacco: Never Used  Substance Use Topics  . Alcohol use: Never    Frequency: Never  . Drug use: Never    Review of  Systems Constitutional: No fever/chills Eyes: No visual changes. ENT: No sore throat. No stiff neck no neck pain Cardiovascular: Denies chest pain. Respiratory: Denies shortness of breath. Gastrointestinal:   no vomiting.  No diarrhea.  No constipation. Genitourinary: Negative for dysuria. Musculoskeletal: Negative lower extremity swelling Skin: Negative for rash. Neurological: Negative for severe headaches, focal weakness or numbness.   ____________________________________________   PHYSICAL EXAM:  VITAL SIGNS: ED Triage Vitals  Enc Vitals Group     BP 10/15/2018 0805 120/70     Pulse Rate 10/18/2018 0805 91     Resp 10/11/2018 0805 (!) 22     Temp 10/14/2018 0805 (!) 97.5 F (36.4 C)     Temp Source 10/27/2018 0805 Oral     SpO2 10/10/2018 0805 95 %     Weight 10/20/2018 0806 120 lb (54.4 kg)     Height 11/03/2018 0806 5\' 6"  (1.676 m)     Head Circumference --      Peak Flow --      Pain Score 10/24/2018 0806 7     Pain Loc --      Pain Edu? --      Excl. in [place]? --     Constitutional: Alert and oriented name and place unsure of the date frail elderly woman in no acute distress. . Eyes: Conjunctivae are normal Head: Atraumatic HEENT: No congestion/rhinnorhea. Mucous membranes are moist.  Oropharynx non-erythematous Neck:   Nontender with no meningismus, no masses, no stridor Cardiovascular: Normal rate, regular rhythm. Grossly normal heart sounds.  Good peripheral circulation. Respiratory: Normal respiratory effort.  No retractions. Lungs CTAB. Abdominal: Soft and nontender. No distention. No guarding no rebound Back:  There is no focal tenderness or step off.  there is no midline tenderness there are no lesions noted. there is no CVA tenderness Musculoskeletal: No lower extremity tenderness, no upper extremity tenderness. No joint effusions, no DVT signs strong distal pulses chronic bilateral symmetric pitting edema Neurologic:  Normal speech and language. No gross focal neurologic  deficits are appreciated.  Skin:  Skin is warm, dry and intact. No rash noted. Psychiatric: Mood and affect are normal. Speech and behavior are normal.  ____________________________________________   LABS (all labs ordered are listed, but only abnormal results are displayed)  Labs Reviewed  CBC WITH DIFFERENTIAL/PLATELET - Abnormal; Notable for the following components:      Result Value   WBC 11.0 (*)    Hemoglobin 15.3 (*)    RDW 21.2 (*)    All other components within normal limits  COMPREHENSIVE METABOLIC PANEL    Pertinent labs  results that were available during my care of the patient were reviewed by me and considered in my medical decision making (see chart for details). ____________________________________________  EKG  I personally interpreted any EKGs ordered by me or triage Underlying rhythm is likely atrial fibrillation with likely intermittent rhythm,, on the monitor she is also completely paced at this time.  PVCs also likely appreciated.  No acute ST elevation or depression. ____________________________________________  RADIOLOGY  Pertinent labs & imaging results that were available during my care of the patient were reviewed by me and considered in my medical decision making (see chart for details). If possible, patient and/or family made aware of any abnormal findings.  No results found. ____________________________________________    PROCEDURES  Procedure(s) performed: None  Procedures  Critical Care performed: None  ____________________________________________   INITIAL IMPRESSION / ASSESSMENT AND PLAN / ED COURSE  Pertinent labs & imaging results that were available during my care of the patient were reviewed by me and considered in my medical decision making (see chart for details).  Patient here after a fall it is unclear why she fell.  Given that she is on blood thinners will obtain CT scan of her head neck.  I do not know if her inability  to describe her fall is representative of her baseline mentation or not we will call the family.  She has absolutely no reproducible pain in her hips or back which I think is reassuring and I think imaging is warranted for those areas.  Given that it is unclear why she fell I will however check a urine basic blood work and a chest x-ray and we will reassess after discussion with family.  ----------------------------------------- 9:54 AM on 10/20/2018 -----------------------------------------  Talk to her daughter, they state that she does have sometimes some confusion they do not think she is off her baseline at this time.  Patient is quite with it generally speaking she was unclear about the fall.  Family states she does have a history of frequent falls and that she usually will try to walk without her walker and fall.  I do not see any other evidence of acute injury at this time I am reassured by her work-up.  I am however concerned by her kidney function.  Is almost double her baseline, I suspect that she has some degree of intravascular depletion and family states that she has not really been eating or drinking at all for some time and has been getting worse.  They are having to essentially force her to drink.  I have concerns about giving her a bolus of fluid in the emergency room as her EF is low and her chest x-ray shows some evidence of CHF.  For this reason, I will start her on a very low dose of fluid for renal protection and we will admit her to the hospitalist for further observation.  She could go either way with this.  Certainly she could have some degree of AKI from her Lasix as well which family states she is still compliant with.  I talked to the hospitalist they agree with management will admit.   ____________________________________________   FINAL CLINICAL IMPRESSION(S) / ED DIAGNOSES  Final diagnoses:  None      This chart was dictated using voice recognition software.   Despite best efforts to proofread,  errors can occur which can change meaning.      Schuyler Amor, MD 11/03/2018 9147    Schuyler Amor, MD 10/31/2018 [phone]

## 2018-10-16 NOTE — Progress Notes (Signed)
Pt was restless. Notify Dr. Aliene Altes and ordered Seroquel 25 mg tablet once. Will continue to monitor.

## 2018-10-16 NOTE — ED Notes (Signed)
Pt asked to call Thayer Headings (daughter). This tech got number from registration and dialed daughter. Pt speaking with her at this time.

## 2018-10-16 NOTE — ED Notes (Signed)
Attempted to call report

## 2018-10-16 NOTE — ED Notes (Signed)
Attempted IV x2 without success Opal Sidles RN to attempt

## 2018-10-16 NOTE — ED Notes (Signed)
Pt O2 sat fluctuating between 85-95% on RA - placed pt on 2L O2 via n/c at this time - admitting provider at bedside and approved

## 2018-10-16 NOTE — ED Notes (Signed)
ED TO INPATIENT HANDOFF REPORT  ED Nurse Name and Phone #: Peregrine Nolt [phone]  S Name/Age/Gender Misty Trevino 85 y.o. female Room/Bed: ED16A/ED16A  Code Status   Code Status: Prior  Home/SNF/Other Home Patient oriented to: self and situation Is this baseline? Yes   Triage Complete: Triage complete  Chief Complaint Fall   Triage Note Pt arrived via ems for report of unwitnessed fall - pt unsure how she fell - pt is oriented to self - disoriented to place and time and states that she was "carrying a baby when she fell" trying to get her to the hospital - pt c/o lower back pain - pt hit neck on a coffee table when she fell - she is on Eliquis   Allergies No Known Allergies  Level of Care/Admitting Diagnosis ED Disposition    ED Disposition Condition [place]: [place] [[id number]]  Level of Care: Telemetry [5]  Diagnosis: Acute renal failure (ARF) [facility]) [[id number]]  Admitting Physician: Hyman Bible DODD [[id number]]  Attending Physician: Hyman Bible DODD [[id number]]  PT Class (Do Not Modify): Observation [104]  PT Acc Code (Do Not Modify): Observation [10022]       B Medical/Surgery History Past Medical History:  Diagnosis Date  . Diabetes mellitus without complication ([place])   . Dyspnea    chronic doe  . Dysrhythmia    afib / block  . Edema    pedal  . GERD (gastroesophageal reflux disease)   . History of orthopnea   . Hypertension   . Sick sinus syndrome ([place])   . Stroke [facility])    tia 2 years ago   Past Surgical History:  Procedure Laterality Date  . BACK SURGERY     disc repair? unsure  . EYE SURGERY Left   . PACEMAKER INSERTION N/A 02/25/2018   Procedure: INSERTION PACEMAKER- INITIAL DUAL CHAMBER;  Surgeon: [name], MD;  Location: [facility] ORS;  Service: Cardiovascular;  Laterality: N/A;  . SHOULDER ARTHROSCOPY Right    x2     A IV Location/Drains/Wounds Patient Lines/Drains/Airways Status   Active  Line/Drains/Airways    Name:   Placement date:   Placement time:   Site:   Days:   Peripheral IV 10/24/2018 Right Forearm   10/15/2018    1011    Forearm   less than 1   External Urinary Catheter   04/14/18    2000    -   185          Intake/Output Last 24 hours No intake or output data in the 24 hours ending 10/27/2018 1323  Labs/Imaging Results for orders placed or performed during the hospital encounter of 10/28/2018 (from the past 48 hour(s))  CBC WITH DIFFERENTIAL     Status: Abnormal   Collection Time: 10/12/2018  8:11 AM  Result Value Ref Range   WBC 11.0 (H) 4.0 - 10.5 K/uL   RBC 4.66 3.87 - 5.11 MIL/uL   Hemoglobin 15.3 (H) 12.0 - 15.0 g/dL   HCT 45.0 36.0 - 46.0 %   MCV 96.6 80.0 - 100.0 fL   MCH 32.8 26.0 - 34.0 pg   MCHC 34.0 30.0 - 36.0 g/dL   RDW 21.2 (H) 11.5 - 15.5 %   Platelets 270 150 - 400 K/uL   nRBC 0.0 0.0 - 0.2 %   Neutrophils Relative % 70 %   Neutro Abs 7.7 1.7 - 7.7 K/uL   Lymphocytes Relative 17 %   Lymphs Abs  1.9 0.7 - 4.0 K/uL   Monocytes Relative 9 %   Monocytes Absolute 1.0 0.1 - 1.0 K/uL   Eosinophils Relative 1 %   Eosinophils Absolute 0.2 0.0 - 0.5 K/uL   Basophils Relative 1 %   Basophils Absolute 0.1 0.0 - 0.1 K/uL   WBC Morphology MORPHOLOGY UNREMARKABLE    Smear Review Normal platelet morphology    Immature Granulocytes 2 %   Abs Immature Granulocytes 0.16 (H) 0.00 - 0.07 K/uL   Target Cells PRESENT     Comment: Performed at [facility], [address] [place], [place] [address]  Comprehensive metabolic panel     Status: Abnormal   Collection Time: 10/30/2018  8:11 AM  Result Value Ref Range   Sodium 140 135 - 145 mmol/L   Potassium 4.1 3.5 - 5.1 mmol/L   Chloride 105 98 - 111 mmol/L   CO2 22 22 - 32 mmol/L   Glucose, Bld 164 (H) 70 - 99 mg/dL   BUN 29 (H) 8 - 23 mg/dL   Creatinine, Ser 1.30 (H) 0.44 - 1.00 mg/dL   Calcium 9.3 8.9 - 10.3 mg/dL   Total Protein 7.0 6.5 - 8.1 g/dL   Albumin 3.9 3.5 - 5.0 g/dL   AST 26 15 - 41  U/L   ALT 22 0 - 44 U/L   Alkaline Phosphatase 96 38 - 126 U/L   Total Bilirubin 1.1 0.3 - 1.2 mg/dL   GFR calc non Af Amer 35 (L) >60 mL/min   GFR calc Af Amer 40 (L) >60 mL/min   Anion gap 13 5 - 15    Comment: Performed at [facility], [address] [place], [place] [address]  Troponin I - Add-On to previous collection     Status: Abnormal   Collection Time: 10/12/2018  8:11 AM  Result Value Ref Range   Troponin I 0.04 (HH) <0.03 ng/mL    Comment: CRITICAL RESULT CALLED TO, READ BACK BY AND VERIFIED WITH  TERESA CLAPP @1000  10/26/2018 MU/JJB Performed at [facility], [address] [place], [place] [address]   Urinalysis, Complete w Microscopic     Status: Abnormal   Collection Time: 11/05/2018  9:17 AM  Result Value Ref Range   Color, Urine YELLOW (A) YELLOW   APPearance CLEAR (A) CLEAR   Specific Gravity, Urine 1.009 1.005 - 1.030   pH 5.0 5.0 - 8.0   Glucose, UA NEGATIVE NEGATIVE mg/dL   Hgb urine dipstick NEGATIVE NEGATIVE   Bilirubin Urine NEGATIVE NEGATIVE   Ketones, ur NEGATIVE NEGATIVE mg/dL   Protein, ur 30 (A) NEGATIVE mg/dL   Nitrite NEGATIVE NEGATIVE   Leukocytes,Ua NEGATIVE NEGATIVE   WBC, UA 0-5 0 - 5 WBC/hpf   Bacteria, UA NONE SEEN NONE SEEN   Squamous Epithelial / LPF 0-5 0 - 5    Comment: Performed at [facility], [address], [place], [place] [address]   Dg Chest 2 View  Result Date: 10/21/2018 CLINICAL DATA:  Fall EXAM: CHEST - 2 VIEW COMPARISON:  04/16/2018 FINDINGS: Left subclavian pacemaker device is stable. The heart is mildly enlarged. Vascular congestion. Mild central basilar edema. Small pleural effusions. IMPRESSION: Mild CHF and small pleural effusions. Electronically Signed   By: Marybelle Killings M.D.   On: 10/30/2018 08:56   Dg Lumbar Spine Complete  Result Date: 10/14/2018 CLINICAL DATA:  Low back pain after a fall. EXAM: LUMBAR SPINE - COMPLETE 4+ VIEW COMPARISON:  None. FINDINGS: There are 5 non  rib-bearing  lumbar type vertebrae. The bones are diffusely osteopenic. There is mild S-shaped thoracolumbar scoliosis, convex right in the lumbar spine. No significant listhesis is evident allowing for obliquity on the lateral radiographs. Obliquity also limits assessment for fracture. Mild wedging of the L1 and L2 vertebral bodies is questioned, and there may be minimal wedging at L3 as well. Disc space narrowing is severe at L4-5 and L5-S1 and moderate at L3-4. There is multilevel endplate osteophyte formation including bulky osteophytes at L3-4. Aortic atherosclerosis is noted. IMPRESSION: 1. Possible mild upper lumbar compression fractures. Correlate for pain in this region and consider lumbar Spine MRI or CT for further evaluation. 2. Advanced lower lumbar disc degeneration. Electronically Signed   By: Logan Bores M.D.   On: 10/31/2018 11:04   Ct Head Wo Contrast  Result Date: 10/20/2018 CLINICAL DATA:  Status post fall today with a blow to the neck. The patient is on anticoagulation therapy. Altered mental status. EXAM: CT HEAD WITHOUT CONTRAST CT CERVICAL SPINE WITHOUT CONTRAST TECHNIQUE: Multidetector CT imaging of the head and cervical spine was performed following the standard protocol without intravenous contrast. Multiplanar CT image reconstructions of the cervical spine were also generated. COMPARISON:  Head CT scan 09/25/2018. FINDINGS: CT HEAD FINDINGS Brain: No evidence of acute infarction, hemorrhage, hydrocephalus, extra-axial collection or mass lesion/mass effect. Atrophy and chronic microvascular ischemic change noted. Vascular: Atherosclerosis. Skull: Intact.  No focal lesion. Sinuses/Orbits: Status post cataract extraction. Otherwise negative. Other: None. CT CERVICAL SPINE FINDINGS Alignment: Normal. Skull base and vertebrae: No acute fracture. No primary bone lesion or focal pathologic process. Soft tissues and spinal canal: No prevertebral fluid or swelling. No visible canal hematoma. Extensive  atherosclerosis noted. Disc levels:  Loss of disc space height is worst at C3-4 and C5-6. Upper chest: Moderate to moderately large bilateral pleural effusions are partially imaged. Other: None. IMPRESSION: No acute abnormality head or cervical spine. Atrophy and chronic microvascular ischemic change. Moderate to moderately large bilateral pleural effusions are partially visualized. Atherosclerosis. Electronically Signed   By: Inge Rise M.D.   On: 10/31/2018 08:43   Ct Cervical Spine Wo Contrast  Result Date: 10/14/2018 CLINICAL DATA:  Status post fall today with a blow to the neck. The patient is on anticoagulation therapy. Altered mental status. EXAM: CT HEAD WITHOUT CONTRAST CT CERVICAL SPINE WITHOUT CONTRAST TECHNIQUE: Multidetector CT imaging of the head and cervical spine was performed following the standard protocol without intravenous contrast. Multiplanar CT image reconstructions of the cervical spine were also generated. COMPARISON:  Head CT scan 09/25/2018. FINDINGS: CT HEAD FINDINGS Brain: No evidence of acute infarction, hemorrhage, hydrocephalus, extra-axial collection or mass lesion/mass effect. Atrophy and chronic microvascular ischemic change noted. Vascular: Atherosclerosis. Skull: Intact.  No focal lesion. Sinuses/Orbits: Status post cataract extraction. Otherwise negative. Other: None. CT CERVICAL SPINE FINDINGS Alignment: Normal. Skull base and vertebrae: No acute fracture. No primary bone lesion or focal pathologic process. Soft tissues and spinal canal: No prevertebral fluid or swelling. No visible canal hematoma. Extensive atherosclerosis noted. Disc levels:  Loss of disc space height is worst at C3-4 and C5-6. Upper chest: Moderate to moderately large bilateral pleural effusions are partially imaged. Other: None. IMPRESSION: No acute abnormality head or cervical spine. Atrophy and chronic microvascular ischemic change. Moderate to moderately large bilateral pleural effusions are  partially visualized. Atherosclerosis. Electronically Signed   By: Inge Rise M.D.   On: 10/22/2018 08:43    Pending Labs Unresulted Labs (From admission, onward)    Start  Ordered   Signed and Held  Nutritional therapist morning,   R     Signed and Held   Signed and Held  CBC  Tomorrow morning,   R     Signed and Held   Signed and Held  Troponin I - Now Then Q6H  Now then every 6 hours,   R     Signed and Held          Vitals/Pain Today's Vitals   10/21/2018 0805 10/23/2018 0806 10/28/2018 0930 10/15/2018 1150  BP: 120/70  126/84   Pulse: 91  65   Resp: (!) 22  14   Temp: (!) 97.5 F (36.4 C)     TempSrc: Oral     SpO2: 95%  91%   Weight:  54.4 kg    Height:  5\' 6"  (1.676 m)    PainSc:  7   8     Isolation Precautions No active isolations  Medications Medications  acetaminophen (TYLENOL) tablet 650 mg (650 mg Oral Given 10/23/2018 1152)    Or  acetaminophen (TYLENOL) suppository 650 mg ( Rectal See Alternative 10/31/2018 1152)  0.9 %  sodium chloride infusion ( Intravenous New Bag/Given 10/18/2018 1101)    Mobility walks with device Moderate fall risk   Focused Assessments Cardiac Assessment Handoff:    Lab Results  Component Value Date   TROPONINI 0.04 (Drain) 10/12/2018   No results found for: DDIMER Does the Patient currently have chest pain? No     R Recommendations: See Admitting Provider Note  Report given to:   Additional Notes: Pt has pacemaker

## 2018-10-16 NOTE — Progress Notes (Signed)
Family Meeting Note  Advance Directive:yes  Today a meeting took place with the Patient.  Patient is able to participate.  The following clinical team members were present during this meeting:MD  The following were discussed:Patient's diagnosis: AKI, Patient's progosis: Unable to determine and Goals for treatment: DNR  Additional follow-up to be provided: prn  Time spent during discussion:20 minutes  Evette Doffing, MD

## 2018-10-16 NOTE — ED Notes (Signed)
Dr Burlene Arnt notified of critical troponin of 0.04 - no new orders at this time

## 2018-10-17 ENCOUNTER — Observation Stay: Payer: Medicare Other

## 2018-10-17 ENCOUNTER — Observation Stay
Admit: 2018-10-17 | Discharge: 2018-10-17 | Disposition: A | Discharge status: Home / Self Care | Payer: Medicare Other | Attending: Internal Medicine | Admitting: Internal Medicine

## 2018-10-17 LAB — CBC
HCT: 45.3 % (ref 36.0–46.0)
Hemoglobin: 15 g/dL (ref 12.0–15.0)
MCH: 33.4 pg (ref 26.0–34.0)
MCHC: 33.1 g/dL (ref 30.0–36.0)
MCV: 100.9 fL — ABNORMAL HIGH (ref 80.0–100.0)
Platelets: 238 10*3/uL (ref 150–400)
RBC: 4.49 MIL/uL (ref 3.87–5.11)
RDW: 21.2 % — ABNORMAL HIGH (ref 11.5–15.5)
WBC: 11.5 10*3/uL — ABNORMAL HIGH (ref 4.0–10.5)
nRBC: 0.2 % (ref 0.0–0.2)

## 2018-10-17 LAB — BASIC METABOLIC PANEL
Anion gap: 15 (ref 5–15)
BUN: 38 mg/dL — ABNORMAL HIGH (ref 8–23)
CO2: 17 mmol/L — ABNORMAL LOW (ref 22–32)
Calcium: 9.2 mg/dL (ref 8.9–10.3)
Chloride: 110 mmol/L (ref 98–111)
Creatinine, Ser: 1.35 mg/dL — ABNORMAL HIGH (ref 0.44–1.00)
GFR calc Af Amer: 39 mL/min — ABNORMAL LOW (ref >60)
GFR calc non Af Amer: 33 mL/min — ABNORMAL LOW (ref >60)
Glucose, Bld: 167 mg/dL — ABNORMAL HIGH (ref 70–99)
Potassium: 5.3 mmol/L — ABNORMAL HIGH (ref 3.5–5.1)
Sodium: 142 mmol/L (ref 135–145)

## 2018-10-17 LAB — ECHOCARDIOGRAM COMPLETE
Height: 66 in
Weight: 1920 oz

## 2018-10-17 MED ORDER — PATIROMER SORBITEX CALCIUM 8.4 G PO PACK
8.4000 g | PACK | Freq: Every day | ORAL | Status: DC
  Administered 2018-10-17: 8.4 g via ORAL
  Filled 2018-10-17 (×2): qty 1

## 2018-10-17 MED ORDER — FUROSEMIDE 40 MG PO TABS
40.0000 mg | ORAL_TABLET | Freq: Every day | ORAL | Status: DC
  Administered 2018-10-17: 14:00:00 40 mg via ORAL
  Filled 2018-10-17: qty 1

## 2018-10-17 NOTE — Progress Notes (Signed)
PT Cancellation Note  Patient Details Name: Misty Trevino MRN: [id number] DOB: [birth year]   Cancelled Treatment:    Reason Eval/Treat Not Completed: Patient not medically ready(Chart reviewed, noted imaging report with recommendations for clarifying imaging studies. Discussed with attending and will hold until it is more clear whether pt will require back precautions and/or LSO. )  1:31 PM, 10/17/18 [name], PT, DPT Physical Therapist - [facility]  [phone] ([place])    [name] 10/17/2018, 1:31 PM

## 2018-10-17 NOTE — Progress Notes (Addendum)
[facility] [place] NAME: Misty Trevino    MR#:  [id number]  DATE OF BIRTH:  12/19/1920  SUBJECTIVE:   Patient states she is doing fine this morning.  She is still endorsing some low back pain.  Has not gotten out of bed.  No chest pain or shortness of breath.  REVIEW OF SYSTEMS:  Review of Systems  Constitutional: Negative for chills and fever.  HENT: Negative for congestion and sore throat.   Eyes: Negative for blurred vision and double vision.  Respiratory: Negative for cough and shortness of breath.   Cardiovascular: Negative for chest pain and palpitations.  Gastrointestinal: Negative for nausea and vomiting.  Genitourinary: Negative for dysuria and urgency.  Musculoskeletal: Positive for back pain and falls. Negative for neck pain.  Neurological: Negative for dizziness and headaches.  Psychiatric/Behavioral: Negative for depression. The patient is not nervous/anxious.     DRUG ALLERGIES:  No Known Allergies VITALS:  Blood pressure 121/72, pulse 61, temperature (!) 97.5 F (36.4 C), temperature source Oral, resp. rate 18, height 5\' 6"  (1.676 m), weight 54.4 kg, SpO2 92 %. PHYSICAL EXAMINATION:  Physical Exam  GENERAL:  Laying in the bed with no acute distress.  Thin appearing. HEENT: Head atraumatic, normocephalic. Pupils equal, round, reactive to light and accommodation. No scleral icterus. Extraocular muscles intact. Oropharynx and nasopharynx clear.  NECK:  Supple, no jugular venous distention. No thyroid enlargement. LUNGS: Lungs are clear to auscultation bilaterally. No wheezes, crackles, rhonchi. No use of accessory muscles of respiration.  CARDIOVASCULAR: RRR, S1, S2 normal. No murmurs, rubs, or gallops.  ABDOMEN: Soft, nontender, nondistended. Bowel sounds present.  EXTREMITIES: No cyanosis, or clubbing. + Mild bilateral pitting edema NEUROLOGIC: CN 2-12 intact, no focal deficits. + Global weakness. Sensation intact  throughout. Gait not checked.  PSYCHIATRIC: The patient is alert and oriented x 2.  SKIN: No obvious rash, lesion, or ulcer.   LABORATORY PANEL:  Female CBC Recent Labs  Lab 10/17/18 0546  WBC 11.5*  HGB 15.0  HCT 45.3  PLT 238   ------------------------------------------------------------------------------------------------------------------ Chemistries  Recent Labs  Lab 11/05/2018 0811 10/17/18 0546  NA 140 142  K 4.1 5.3*  CL 105 110  CO2 22 17*  GLUCOSE 164* 167*  BUN 29* 38*  CREATININE 1.30* 1.35*  CALCIUM 9.3 9.2  AST 26  --   ALT 22  --   ALKPHOS 96  --   BILITOT 1.1  --    RADIOLOGY:  Dg Chest 1 View  Result Date: 10/17/2018 CLINICAL DATA:  Dyspnea EXAM: CHEST  1 VIEW COMPARISON:  Chest radiograph from one day prior. FINDINGS: Stable configuration of 2 lead left subclavian pacemaker. Stable cardiomediastinal silhouette with mild cardiomegaly. No pneumothorax. Small bilateral pleural effusions, stable. Borderline mild pulmonary edema, stable. Stable medial bibasilar lung consolidation. IMPRESSION: 1. Stable mild cardiomegaly, mild pulmonary edema and small bilateral pleural effusions. 2. Stable medial bibasilar lung consolidation, favor atelectasis. Electronically Signed   By: Ilona Sorrel M.D.   On: 10/17/2018 08:59   Ct Lumbar Spine Wo Contrast  Result Date: 10/17/2018 CLINICAL DATA:  Back pain for 6 weeks.  History of diabetes. EXAM: CT LUMBAR SPINE WITHOUT CONTRAST TECHNIQUE: Multidetector CT imaging of the lumbar spine was performed without intravenous contrast administration. Multiplanar CT image reconstructions were also generated. COMPARISON:  Radiographs 10/08/2018. Report only from MRI lumbar spine 04/07/2002. Chest CT 04/12/2018. FINDINGS: Segmentation: Transitional lumbosacral anatomy. In correlation with prior chest CT, there are small  ribs at what is labeled L1 and a transitional, partially lumbarized S1 segment. Alignment: Mild convex right scoliosis. The  lateral alignment is normal. Vertebrae: The bones are demineralized. There is a mild superior endplate compression deformity at L1 which appears acute. There is up to 4 mm of osseous retropulsion. There is less than 20% loss of vertebral body height. The posterior elements are intact. No other acute fractures are identified. Paraspinal and other soft tissues: No evidence of paraspinal hematoma. There is generalized soft tissue edema with moderate size bilateral pleural effusions. Patient has a pacemaker, aortic atherosclerosis and cardiomegaly. Disc levels: T12-L1: Mild disc bulging. L1 compression fracture as described above. No significant spinal stenosis or nerve root encroachment. L1-2: No significant findings. L2-3: Loss of disc height with annular disc bulging and endplate osteophytes asymmetric to the left. Mild facet and ligamentous hypertrophy. Mild spinal stenosis with mild asymmetric narrowing of the left lateral recess. L3-4: Spondylosis with loss of disc height, annular disc bulging and endplate osteophytes asymmetric to the left. Moderate facet and ligamentous hypertrophy. These factors contribute to moderate to severe multifactorial spinal stenosis and narrowing of both lateral recesses. L4-5: Chronic loss of disc height with endplate osteophytes and bilateral facet hypertrophy. The facet joints may be ankylosed. There is resulting moderate spinal stenosis and mild narrowing of the lateral recesses. L5-S1: Chronic degenerative disc disease with loss of disc height, endplate osteophytes and bilateral facet hypertrophy. No significant spinal stenosis or nerve root encroachment. S1-2: Transitional disc space level. No significant spinal stenosis. IMPRESSION: 1. Acute appearing mild superior endplate compression fracture at L1 with mild osseous retropulsion. 2. No other acute osseous findings. 3. Multilevel spondylosis as described. Resulting moderate to severe multifactorial spinal stenosis at L3-4 and  moderate spinal stenosis at L4-5. 4. Moderate bilateral pleural effusions. Electronically Signed   By: Richardean Sale M.D.   On: 10/17/2018 12:37   ASSESSMENT AND PLAN:   Acute kidney injury- likely due to decreased p.o. intake, intravascular depletion, cardiorenal syndrome.   Creatinine slightly worse today. -Hold lisinopril -Will give a dose of Lasix 40 mg p.o. x 1 -Continue holding IV fluids -Renal ultrasound unremarkable -Avoid nephrotoxic agents -Recheck creatinine in the morning  Unwitnessed fall- patient denies any syncope.  CT head and C-spine negative.  Patient complaining of low back pain. -Lumbar x-ray with possible mild upper lumbar compression fractures -Will obtain CT L-spine for further evaluation  Acute hypoxic respiratory failure secondary to mild exacerbation of chronic systolic congestive heart failure- last EF 35 to 40% 10/19.  Chest x-ray with findings consistent with CHF. -Repeat chest x-ray this morning with mild pulmonary edema and small bilateral pleural effusions -Will give a one-time dose of Lasix 40 mg p.o. -ECHO with worsening ejection fraction of 20-25% and left ventricular global hypokinesis, also with severe pulmonary hypertension -Continue Coreg -Holding lisinopril with AKI -Wean O2 as able -Cardiac monitoring  Hyperkalemia- likely related to AKI.  K 5.3. -Will give [place] x1  Mildly elevated troponin- likely demand ischemia. No active chest pain. -Trend troponin  Paroxysmal atrial fibrillation-patient in normal sinus rhythm here. Has  -Continue Eliquis and Coreg  Type 2 diabetes-blood sugars mildly elevated in the ED -Due to age, will hold off on frequent CBG checks and sliding scale insulin at this time -Monitor blood sugar with morning BMP  DVT prophylaxis-Eliquis  All the records are reviewed and case discussed with Care Management/Social Worker. Management plans discussed with the patient, family and they are in agreement.   CODE  STATUS: DNR  TOTAL TIME TAKING CARE OF THIS PATIENT: 45 minutes.   More than 50% of the time was spent in counseling/coordination of care: YES  POSSIBLE D/C IN 1-2 DAYS, DEPENDING ON CLINICAL CONDITION.   Berna Spare Mayo M.D on 10/17/2018 at 4:12 PM  Between 7am to 6pm - Pager - [phone]  After 6pm go to [email or web address] - Proofreader  [facility]  Office  [phone]  CC: Primary care physician; Albina Billet, MD  Note: This dictation was prepared with Dragon dictation along with smaller phrase technology. Any transcriptional errors that result from this process are unintentional.

## 2018-10-17 NOTE — Care Management Obs Status (Signed)
MEDICARE OBSERVATION STATUS NOTIFICATION   Patient Details  Name: Misty Trevino MRN: [id number] Date of Birth: Jan 09, 1921   Medicare Observation Status Notification Given:  Yes    Zackary Mckeone A Montrice Montuori, RN 10/17/2018, 3:30 PM

## 2018-10-17 NOTE — Plan of Care (Signed)
  Problem: Safety: Goal: Ability to remain free from injury will improve 10/17/2018 0035 by Liliane Channel, RN Outcome: Progressing 10/17/2018 0033 by Liliane Channel, RN Outcome: Progressing

## 2018-10-18 ENCOUNTER — Observation Stay: Payer: Medicare Other

## 2018-10-18 DIAGNOSIS — Z7901 Long term (current) use of anticoagulants: Secondary | ICD-10-CM | POA: Diagnosis not present

## 2018-10-18 DIAGNOSIS — S32018A Other fracture of first lumbar vertebra, initial encounter for closed fracture: Secondary | ICD-10-CM | POA: Diagnosis present

## 2018-10-18 DIAGNOSIS — W19XXXA Unspecified fall, initial encounter: Secondary | ICD-10-CM | POA: Diagnosis present

## 2018-10-18 DIAGNOSIS — F0390 Unspecified dementia without behavioral disturbance: Secondary | ICD-10-CM | POA: Diagnosis present

## 2018-10-18 DIAGNOSIS — R4182 Altered mental status, unspecified: Secondary | ICD-10-CM | POA: Diagnosis present

## 2018-10-18 DIAGNOSIS — Z79890 Hormone replacement therapy (postmenopausal): Secondary | ICD-10-CM | POA: Diagnosis not present

## 2018-10-18 DIAGNOSIS — K219 Gastro-esophageal reflux disease without esophagitis: Secondary | ICD-10-CM | POA: Diagnosis present

## 2018-10-18 DIAGNOSIS — I272 Pulmonary hypertension, unspecified: Secondary | ICD-10-CM | POA: Diagnosis present

## 2018-10-18 DIAGNOSIS — E119 Type 2 diabetes mellitus without complications: Secondary | ICD-10-CM | POA: Diagnosis present

## 2018-10-18 DIAGNOSIS — Z66 Do not resuscitate: Secondary | ICD-10-CM | POA: Diagnosis present

## 2018-10-18 DIAGNOSIS — E875 Hyperkalemia: Secondary | ICD-10-CM | POA: Diagnosis present

## 2018-10-18 DIAGNOSIS — I110 Hypertensive heart disease with heart failure: Secondary | ICD-10-CM | POA: Diagnosis present

## 2018-10-18 DIAGNOSIS — I5023 Acute on chronic systolic (congestive) heart failure: Secondary | ICD-10-CM | POA: Diagnosis present

## 2018-10-18 DIAGNOSIS — Z950 Presence of cardiac pacemaker: Secondary | ICD-10-CM | POA: Diagnosis not present

## 2018-10-18 DIAGNOSIS — J9601 Acute respiratory failure with hypoxia: Secondary | ICD-10-CM | POA: Diagnosis present

## 2018-10-18 DIAGNOSIS — I480 Paroxysmal atrial fibrillation: Secondary | ICD-10-CM | POA: Diagnosis present

## 2018-10-18 DIAGNOSIS — Z79899 Other long term (current) drug therapy: Secondary | ICD-10-CM | POA: Diagnosis not present

## 2018-10-18 DIAGNOSIS — N179 Acute kidney failure, unspecified: Secondary | ICD-10-CM | POA: Diagnosis present

## 2018-10-18 DIAGNOSIS — Z8673 Personal history of transient ischemic attack (TIA), and cerebral infarction without residual deficits: Secondary | ICD-10-CM | POA: Diagnosis not present

## 2018-10-18 DIAGNOSIS — Z515 Encounter for palliative care: Secondary | ICD-10-CM | POA: Diagnosis not present

## 2018-10-18 DIAGNOSIS — I495 Sick sinus syndrome: Secondary | ICD-10-CM | POA: Diagnosis present

## 2018-10-18 DIAGNOSIS — Z7984 Long term (current) use of oral hypoglycemic drugs: Secondary | ICD-10-CM | POA: Diagnosis not present

## 2018-10-18 LAB — BASIC METABOLIC PANEL
Anion gap: 18 — ABNORMAL HIGH (ref 5–15)
BUN: 49 mg/dL — ABNORMAL HIGH (ref 8–23)
CO2: 16 mmol/L — ABNORMAL LOW (ref 22–32)
Calcium: 9.1 mg/dL (ref 8.9–10.3)
Chloride: 109 mmol/L (ref 98–111)
Creatinine, Ser: 1.52 mg/dL — ABNORMAL HIGH (ref 0.44–1.00)
GFR calc Af Amer: 33 mL/min — ABNORMAL LOW (ref >60)
GFR calc non Af Amer: 29 mL/min — ABNORMAL LOW (ref >60)
Glucose, Bld: 178 mg/dL — ABNORMAL HIGH (ref 70–99)
Potassium: 5.1 mmol/L (ref 3.5–5.1)
Sodium: 143 mmol/L (ref 135–145)

## 2018-10-18 MED ORDER — SODIUM CHLORIDE 0.9 % IV SOLN
INTRAVENOUS | Status: DC
  Administered 2018-10-18: 10:00:00 via INTRAVENOUS

## 2018-10-18 MED ORDER — GLYCOPYRROLATE 0.2 MG/ML IJ SOLN
0.1000 mg | INTRAMUSCULAR | Status: DC | PRN
  Filled 2018-10-18: qty 0.5

## 2018-10-18 MED ORDER — MORPHINE SULFATE (PF) 2 MG/ML IV SOLN
1.0000 mg | INTRAVENOUS | Status: DC | PRN
  Administered 2018-10-18: 1 mg via INTRAVENOUS
  Filled 2018-10-18: qty 1

## 2018-10-18 MED ORDER — LORAZEPAM 2 MG/ML IJ SOLN: 0.5000 mg | INTRAMUSCULAR | Status: DC | PRN

## 2018-10-18 NOTE — Plan of Care (Signed)
  Problem: Pain Managment: Goal: General experience of comfort will improve Outcome: Progressing   Problem: Safety: Goal: Ability to remain free from injury will improve Outcome: Progressing   

## 2018-10-18 NOTE — Progress Notes (Signed)
Family Meeting Note  Advance Directive:yes  Today a meeting took place with the daughter.  Patient is unable to participate due to somnolence.   The following clinical team members were present during this meeting:MD  The following were discussed:Patient's diagnosis: cardiorenal syndrome, Patient's progosis: Unable to determine and Goals for treatment: transition to full comfort care.  I had a long discussion with patient's daughter over the phone. During this hospitalization, patient has been declining. Her EF has decreased to 20-25%. She has not eaten or drank anything. Her urine output is minimal. Her kidney function continues to get worse. I discussed with daughter that if we give fluids to help her kidneys, we will make her respiratory status worse because she has pulmonary edema on her CXR. If we give lasix to help her respiratory status, we will make her kidneys worse. Patient's daughter states that patient has been declining over the last 6 weeks and she has feared that patient is at the end of her life. Daughter states that she does not want to see her mother in pain. She would like to transition patient to full comfort care. She is interested in having patient transferred to residential hospice tomorrow if there is a bed available.  Additional follow-up to be provided: will follow-up tomorrow  Time spent during discussion:30 minutes  Evette Doffing, MD

## 2018-10-18 NOTE — TOC Progression Note (Addendum)
Transition of Care [facility]) - Progression Note    Patient Details  Name: Misty Trevino MRN: [id number] Date of Birth: 01/24/1921  [facility]) CM/SW Contact  Ross Ludwig, [place] Phone Number: 10/18/2018, 6:06 PM  Clinical Narrative:     CSW received phone call back from [facility], and they may have a bed available tomorrow, depending on how patient is doing.  CSW received a phone call back from patient's daughter Jan, who is aware that patient may have a bed available depending on if she is medically stable for transport.  Jan requested to have CSW update her in the morning.   Expected Discharge Plan: [place] Barriers to Discharge: Continued Medical Work up, Hospice Bed not available  Expected Discharge Plan and Services Expected Discharge Plan: [place] In-house Referral: Clinical Social Work Discharge Planning Services: NA Post Acute Care Choice: NA Living arrangements for the past 2 months: Single Family Home Expected Discharge Date: 10/18/18               DME Arranged: N/A DME Agency: NA HH Arranged: NA HH Agency: NA   Social Determinants of Health (SDOH) Interventions    Readmission Risk Interventions No flowsheet data found.

## 2018-10-18 NOTE — Progress Notes (Signed)
[facility] [place] NAME: Misty Trevino    MR#:  [id number]  DATE OF BIRTH:  September 11, 1920  SUBJECTIVE:   Patient has not eaten or drank anything since being in the hospital.  Very low urine output.  She continues to be hypoxic.  Kidney function worsening.  She is a little confused and somnolent.  REVIEW OF SYSTEMS:  Review of Systems  Constitutional: Negative for chills and fever.  HENT: Negative for congestion and sore throat.   Eyes: Negative for blurred vision and double vision.  Respiratory: Negative for cough and shortness of breath.   Cardiovascular: Negative for chest pain and palpitations.  Gastrointestinal: Negative for nausea and vomiting.  Genitourinary: Negative for dysuria and urgency.  Musculoskeletal: Positive for back pain and falls. Negative for neck pain.  Neurological: Negative for dizziness and headaches.  Psychiatric/Behavioral: Negative for depression. The patient is not nervous/anxious.     DRUG ALLERGIES:  No Known Allergies VITALS:  Blood pressure (!) 134/99, pulse 60, temperature (!) 97.5 F (36.4 C), temperature source Axillary, resp. rate 14, height 5\' 6"  (1.676 m), weight 54.4 kg, SpO2 98 %. PHYSICAL EXAMINATION:  Physical Exam  GENERAL:  Laying in the bed with no acute distress.  Thin appearing.  Sleepy but easily arousable. HEENT: Head atraumatic, normocephalic. Pupils equal, round, reactive to light and accommodation. No scleral icterus. Extraocular muscles intact. Oropharynx and nasopharynx clear.  NECK:  Supple, no jugular venous distention. No thyroid enlargement. LUNGS: Lungs are clear to auscultation bilaterally. No wheezes, crackles, rhonchi. No use of accessory muscles of respiration.  CARDIOVASCULAR: RRR, S1, S2 normal. No murmurs, rubs, or gallops. + Peripheral pulses ABDOMEN: Soft, nontender, nondistended. Bowel sounds present.  EXTREMITIES: No cyanosis, or clubbing. + Mild bilateral pitting  edema NEUROLOGIC: CN 2-12 intact, no focal deficits. + Global weakness. Sensation intact throughout. Gait not checked.  PSYCHIATRIC: The patient is alert, not answer questions of orientation SKIN: No obvious rash, lesion, or ulcer.   LABORATORY PANEL:  Female CBC Recent Labs  Lab 10/17/18 0546  WBC 11.5*  HGB 15.0  HCT 45.3  PLT 238   ------------------------------------------------------------------------------------------------------------------ Chemistries  Recent Labs  Lab 10/21/2018 0811  10/18/18 0344  NA 140   < > 143  K 4.1   < > 5.1  CL 105   < > 109  CO2 22   < > 16*  GLUCOSE 164*   < > 178*  BUN 29*   < > 49*  CREATININE 1.30*   < > 1.52*  CALCIUM 9.3   < > 9.1  AST 26  --   --   ALT 22  --   --   ALKPHOS 96  --   --   BILITOT 1.1  --   --    < > = values in this interval not displayed.   RADIOLOGY:  Dg Chest 1 View  Result Date: 10/18/2018 CLINICAL DATA:  Initial evaluation for acute shortness of breath. EXAM: CHEST  1 VIEW COMPARISON:  Prior radiograph from 10/17/2018 FINDINGS: Left-sided pacemaker/AICD in place, stable. Cardiomegaly unchanged. Mediastinal silhouette normal. Aortic atherosclerosis. Persistent small to moderate pleural effusions, left greater than right, similar to previous. Mild diffuse pulmonary interstitial edema, little interval changed. Superimposed dense opacity at the retrocardiac left lower lobe could reflect atelectasis or infiltrate, similar. No other new focal airspace disease. No pneumothorax. No acute osseous finding. IMPRESSION: 1. Persistent small moderate bilateral pleural effusions, left greater than right. Associated  dense opacity at the retrocardiac left lower lobe favored to reflect atelectasis, also similar. 2. Cardiomegaly with mild diffuse pulmonary interstitial edema, little interval changed. Electronically Signed   By: Jeannine Boga M.D.   On: 10/18/2018 05:43   ASSESSMENT AND PLAN:   Acute kidney injury- likely due  to decreased p.o. intake, intravascular depletion, cardiorenal syndrome.   Creatinine continues to worsen. -Discussed goals of care with daughter over the phone today, will transition patient to full comfort care -Plan for possible transfer to hospice home tomorrow if bed is available -Start comfort meds including morphine, Ativan, Robinul  Acute L1 compression fracture- seen on CT L-spine -Tylenol and morphine as needed back pain  Acute hypoxic respiratory failure secondary to mild exacerbation of chronic systolic congestive heart failure -Continue supplemental oxygen -Morphine and Ativan added as needed for respiratory distress  All the records are reviewed and case discussed with Care Management/Social Worker. Management plans discussed with the patient, family and they are in agreement.  CODE STATUS: DNR  TOTAL TIME TAKING CARE OF THIS PATIENT: 50 minutes.   More than 50% of the time was spent in counseling/coordination of care: YES  POSSIBLE D/C IN 1-2 DAYS, DEPENDING ON CLINICAL CONDITION.   Berna Spare Anselmo Reihl M.D on 10/18/2018 at 2:56 PM  Between 7am to 6pm - Pager - [phone]  After 6pm go to [email or web address] - Proofreader  [facility]  Office  [phone]  CC: Primary care physician; Albina Billet, MD  Note: This dictation was prepared with Dragon dictation along with smaller phrase technology. Any transcriptional errors that result from this process are unintentional.

## 2018-10-18 NOTE — Progress Notes (Signed)
Patient has deteriorated since just this morning at 0800. Her pulse is thready.  She is more lethargic.  It's very difficult to get a pulse ox.  Dr. Brett Albino notified.  She will come see the patient.

## 2018-10-18 NOTE — Progress Notes (Signed)
No recorded output since 0145.  Bladder scan = 187.  REsults in 12 ml/hr.  Will contact Dr. Brett Albino.

## 2018-10-18 NOTE — Progress Notes (Signed)
Patients daughter has been allowed to visit her mother since her death may be soon.  She has been instructed to stay in the patient's room and to wear a surgical mask whenever anyone enters the room.

## 2018-10-18 NOTE — TOC Initial Note (Signed)
Transition of Care [facility]) - Initial/Assessment Note    Patient Details  Name: Misty Trevino MRN: [id number] Date of Birth: [birth year]  Transition of Care [facility]) CM/SW Contact:    Ross Ludwig, LCSW Phone Number: 10/18/2018, 4:22 PM  Clinical Narrative:   Patient is a 85 year old female who is alert and oriented x1.  Patient was sleeping, CSW spoke to patient's daughter Jan to complete assessment.  Patient's daughter stated that patient normally is alert and oriented, and she has had a steady decline for the last 6 weeks.  Patient's daughter spoke to physician, and she has decided to switch patient to comfort care.  CSW was informed by patient's daughter that she would like to go to [facility] hospice facility.  CSW contacted [facility], they said they can not take patient today, but they should be able to accept her tomorrow as long as she is medically stable for transport.  CSW attempted to updated patient's daughter Jan, Chelsea had to leave a message.  CSW to continue to follow patient's progress throughout discharge planning.                  Expected Discharge Plan: [place] Barriers to Discharge: Continued Medical Work up, Hospice Bed not available   Patient Goals and CMS Choice Patient states their goals for this hospitalization and ongoing recovery are:: Patient's daughter would like patient to to [facility] hospice facility in [place]. CMS Medicare.gov Compare Post Acute Care list provided to:: Patient Represenative (must comment) Choice offered to / list presented to : Adult Children  Expected Discharge Plan and Services Expected Discharge Plan: [place] In-house Referral: Clinical Social Work Discharge Planning Services: NA Post Acute Care Choice: NA Living arrangements for the past 2 months: [place] Expected Discharge Date: 10/18/18               DME Arranged: N/A DME Agency: NA HH Arranged: NA HH Agency: NA  Prior Living  Arrangements/Services Living arrangements for the past 2 months: Single Family Home Lives with:: Adult Children Patient language and need for interpreter reviewed:: No Do you feel safe going back to the place where you live?: No   Patient's family would like the hospice facility for end of life care.  Need for Family Participation in Patient Care: Yes (Comment) Care giver support system in place?: Yes (comment) Current home services: Home PT, Home RN, Homehealth aide Criminal Activity/Legal Involvement Pertinent to Current Situation/Hospitalization: No - Comment as needed  Activities of Daily Living Home Assistive Devices/Equipment: [place] Rile (specify type) ADL Screening (condition at time of admission) Patient's cognitive ability adequate to safely complete daily activities?: Yes Is the patient deaf or have difficulty hearing?: No Does the patient have difficulty seeing, even when wearing glasses/contacts?: No Does the patient have difficulty concentrating, remembering, or making decisions?: Yes Patient able to express need for assistance with ADLs?: Yes Does the patient have difficulty dressing or bathing?: Yes Independently performs ADLs?: No Communication: Independent Dressing (OT): Dependent Is this a change from baseline?: Pre-admission baseline Grooming: Needs assistance Is this a change from baseline?: Pre-admission baseline Feeding: Independent(most of the time) Bathing: Dependent Is this a change from baseline?: Pre-admission baseline Toileting: Needs assistance Is this a change from baseline?: Pre-admission baseline In/Out Bed: Needs assistance Is this a change from baseline?: Pre-admission baseline Walks in Home: Needs assistance(with walker) Is this a change from baseline?: Pre-admission baseline Does the patient have difficulty walking or climbing stairs?: Yes Weakness  of Legs: Both Weakness of Arms/Hands: Both  Permission Sought/Granted Permission sought to share  information with : Facility Sport and exercise psychologist, Family Supports Permission granted to share information with : Yes, Verbal Permission Granted  Share Information with NAME: Ethlyn Gallery Daughter   [phone]   Permission granted to share info w AGENCY: Hospice facility admissions        Emotional Assessment Appearance:: Appears stated age   Affect (typically observed): Accepting, Stable, Appropriate, Quiet Orientation: : Oriented to Self Alcohol / Substance Use: Not Applicable Psych Involvement: No (comment)  Admission diagnosis:  AKI (acute kidney injury) ([place]) [N17.9] Fall, initial encounter [W19.XXXA] Patient Active Problem List   Diagnosis Date Noted  . Altered mental status 10/18/2018  . Acute renal failure (ARF) ([place]) 10/29/2018  . Chest pain 04/12/2018  . Sepsis ([place]) 04/12/2018  . [place] type 2 second degree heart block 02/25/2018   PCP:  Albina Billet, MD Pharmacy:   [facility], [address] [place] [address] Phone: [phone] Fax: [phone]     Social Determinants of Health (SDOH) Interventions    Readmission Risk Interventions No flowsheet data found.

## 2018-11-06 NOTE — Progress Notes (Signed)
New hospice home referral received on 4/12 from [place]. Patient died at [facility] this morning. Referral and the hospice home notified. Thank you. Flo Shanks BSN, RN, [facility] [phone]

## 2018-11-06 NOTE — Progress Notes (Signed)
Misty Trevino at [place] NAME: Misty Trevino    MR#:  [id number]  DATE OF BIRTH:  1920/10/26  SUBJECTIVE:   Patient actively dying. Daughter at bedside.  REVIEW OF SYSTEMS:  Review of Systems  Constitutional: Negative for chills and fever.  HENT: Negative for congestion and sore throat.   Eyes: Negative for blurred vision and double vision.  Respiratory: Negative for cough and shortness of breath.   Cardiovascular: Negative for chest pain and palpitations.  Gastrointestinal: Negative for nausea and vomiting.  Genitourinary: Negative for dysuria and urgency.  Musculoskeletal: Positive for back pain and falls. Negative for neck pain.  Neurological: Negative for dizziness and headaches.  Psychiatric/Behavioral: Negative for depression. The patient is not nervous/anxious.     DRUG ALLERGIES:  No Known Allergies VITALS:  Blood pressure (!) 98/45, pulse 68, temperature 98.2 F (36.8 C), resp. rate 14, height 5\' 6"  (1.676 m), weight 54.4 kg, SpO2 (!) 82 %. PHYSICAL EXAMINATION:  Physical Exam  GENERAL:  Laying in the bed with no acute distress.  Thin appearing.   HEENT: Head atraumatic, normocephalic. Pupils equal, round, reactive to light and accommodation. No scleral icterus. Extraocular muscles intact. Oropharynx and nasopharynx clear.  NECK:  Supple, no jugular venous distention. No thyroid enlargement. LUNGS: Lungs are clear to auscultation bilaterally. No wheezes, crackles, rhonchi. No use of accessory muscles of respiration.  CARDIOVASCULAR: RRR, S1, S2 normal. No murmurs, rubs, or gallops. + Peripheral pulses ABDOMEN: Soft, nontender, nondistended. Bowel sounds present.  EXTREMITIES: No cyanosis, or clubbing. + Mild bilateral pitting edema NEUROLOGIC: Unable to assess PSYCHIATRIC:Unable to assess SKIN: No obvious rash, lesion, or ulcer.   LABORATORY PANEL:  Female CBC Recent Labs  Lab 10/17/18 0546  WBC 11.5*  HGB 15.0  HCT  45.3  PLT 238   ------------------------------------------------------------------------------------------------------------------ Chemistries  Recent Labs  Lab 10/11/2018 0811  10/18/18 0344  NA 140   < > 143  K 4.1   < > 5.1  CL 105   < > 109  CO2 22   < > 16*  GLUCOSE 164*   < > 178*  BUN 29*   < > 49*  CREATININE 1.30*   < > 1.52*  CALCIUM 9.3   < > 9.1  AST 26  --   --   ALT 22  --   --   ALKPHOS 96  --   --   BILITOT 1.1  --   --    < > = values in this interval not displayed.   RADIOLOGY:  No results found. ASSESSMENT AND PLAN:   Acute kidney injury- likely due to decreased p.o. intake, intravascular depletion, cardiorenal syndrome.   Creatinine continues to worsen. -Continue full comfort care -Anticipate hospital death  Acute L1 compression fracture- seen on CT L-spine -Tylenol and morphine as needed back pain  Acute hypoxic respiratory failure secondary to mild exacerbation of chronic systolic congestive heart failure -Continue supplemental oxygen -Morphine and Ativan added as needed for respiratory distress  All the records are reviewed and case discussed with Care Management/Social Worker. Management plans discussed with the patient, family and they are in agreement.  CODE STATUS: DNR  TOTAL TIME TAKING CARE OF THIS PATIENT: 50 minutes.   More than 50% of the time was spent in counseling/coordination of care: YES   Evette Doffing M.D on 2018-11-18 at 4:19 PM  Between 7am to 6pm - Pager - [phone]  After 6pm go to [email or web address] -  password [email or web address] [facility]  [facility]  Office  [phone]  CC: Primary care physician; Albina Billet, MD  Note: This dictation was prepared with Dragon dictation along with smaller phrase technology. Any transcriptional errors that result from this process are unintentional.

## 2018-11-06 NOTE — Discharge Summary (Signed)
[place] at [place] NAME: Misty Trevino    MR#:  [id number]  DATE OF BIRTH:  1920-12-18  DATE OF ADMISSION:  10/24/2018   ADMITTING PHYSICIAN: Sela Hua, MD  DATE OF DEATH: 11-15-18 11:30 AM  PRIMARY CARE PHYSICIAN: Albina Billet, MD   ADMISSION DIAGNOSIS:  AKI (acute kidney injury) ([place]) [N17.9] Fall, initial encounter [W19.XXXA]  SECONDARY DIAGNOSIS:   Past Medical History:  Diagnosis Date  . Diabetes mellitus without complication ([place])   . Dyspnea    chronic doe  . Dysrhythmia    afib / block  . Edema    pedal  . GERD (gastroesophageal reflux disease)   . History of orthopnea   . Hypertension   . Sick sinus syndrome ([place])   . Stroke ([place])    tia 2 years ago   HOSPITAL COURSE:   Misty Trevino is a 85 year old female who presented to the ED with unwitnessed fall at home and decreased p.o. intake over the last month.  In the ED, she was noted to be hypoxic to 87% on room air and was placed on 2 L O2 by nasal cannula.  She was also noted to have an AKI.  Chest x-ray showed pulmonary vascular congestion and small bilateral pleural effusions.  She was admitted for further management.  She was given a small dose of IV Lasix, but her renal function continued to worsen.  She was trialed on some gentle fluids, but her respiratory status worsened and she had an increasing oxygen requirement.  She was also noted to have an acute L1 compression fracture on CT L-spine.  Goals of care discussion was held with her daughter, and the decision was made to transition her to full comfort care.  Patient passed away in the hospital on 11/18/2018  DISCHARGE CONDITION:  Expired  DATA REVIEW:   CBC Recent Labs  Lab 10/17/18 0546  WBC 11.5*  HGB 15.0  HCT 45.3  PLT 238    Chemistries  Recent Labs  Lab 10/21/2018 0811  10/18/18 0344  NA 140   < > 143  K 4.1   < > 5.1  CL 105   < > 109  CO2 22   < > 16*  GLUCOSE 164*   < > 178*  BUN  29*   < > 49*  CREATININE 1.30*   < > 1.52*  CALCIUM 9.3   < > 9.1  AST 26  --   --   ALT 22  --   --   ALKPHOS 96  --   --   BILITOT 1.1  --   --    < > = values in this interval not displayed.     Microbiology Results  Results for orders placed or performed during the hospital encounter of 04/12/18  CULTURE, BLOOD (ROUTINE X 2) w Reflex to ID Panel     Status: None   Collection Time: 04/12/18  5:34 AM  Result Value Ref Range Status   Specimen Description BLOOD RIGHT FOREARM  Final   Special Requests   Final    BOTTLES DRAWN AEROBIC ONLY Blood Culture results may not be optimal due to an inadequate volume of blood received in culture bottles   Culture   Final    NO GROWTH 5 DAYS Performed at [facility], [address], [place], [place] [address]    Report Status 04/21/2018 FINAL  Final  CULTURE, BLOOD (ROUTINE X 2)  w Reflex to ID Panel     Status: None   Collection Time: 04/12/18  5:35 AM  Result Value Ref Range Status   Specimen Description BLOOD RIGHT HAND  Final   Special Requests   Final    BOTTLES DRAWN AEROBIC ONLY Blood Culture results may not be optimal due to an inadequate volume of blood received in culture bottles   Culture   Final    NO GROWTH 5 DAYS Performed at [facility], [address] [place], [place] [address]    Report Status 04/21/2018 FINAL  Final  MRSA PCR Screening     Status: None   Collection Time: 04/13/18 11:46 AM  Result Value Ref Range Status   MRSA by PCR NEGATIVE NEGATIVE Final    Comment:        The GeneXpert MRSA Assay (FDA approved for NASAL specimens only), is one component of a comprehensive MRSA colonization surveillance program. It is not intended to diagnose MRSA infection nor to guide or monitor treatment for MRSA infections. Performed at [facility], [address] [place], [place] [address]     RADIOLOGY:  No results found.   Management plans discussed with the patient, family and  they are in agreement.  CODE STATUS: Prior   TOTAL TIME TAKING CARE OF THIS PATIENT: 45 minutes.    Berna Spare Mayo M.D on 10/20/2018 at 1:46 PM  Between 7am to 6pm - Pager - [phone]  After 6pm go to [email or web address] - Proofreader  [facility] [place] [facility]  Office  [phone]  CC: Primary care physician; Albina Billet, MD   Note: This dictation was prepared with Dragon dictation along with smaller phrase technology. Any transcriptional errors that result from this process are unintentional.

## 2018-11-06 NOTE — Progress Notes (Signed)
Pt daughter in room at bedside, patient found to have passed away at approx 0930 this morning by RN Ria Comment and daughter, Jan. Dr Brett Albino to floor to announce. Patient daughter has taken all valuables home and [facility] notified. Patient bathed and prepped for funeral home, transport notified.

## 2018-11-06 DEATH — deceased

## 2019-08-17 IMAGING — CR DG CHEST 2V
1 series · 2 of 2 positions shown · non-contrast
Comparison: Chest CT November 05, 2012

CLINICAL DATA: Preoperative assessment.  Atrial fibrillation

EXAM:
CHEST - 2 VIEW

[Series 1: dg chest 2 view · 0.14mm/px · 2 of 2 slices shown]
[im 1/2]
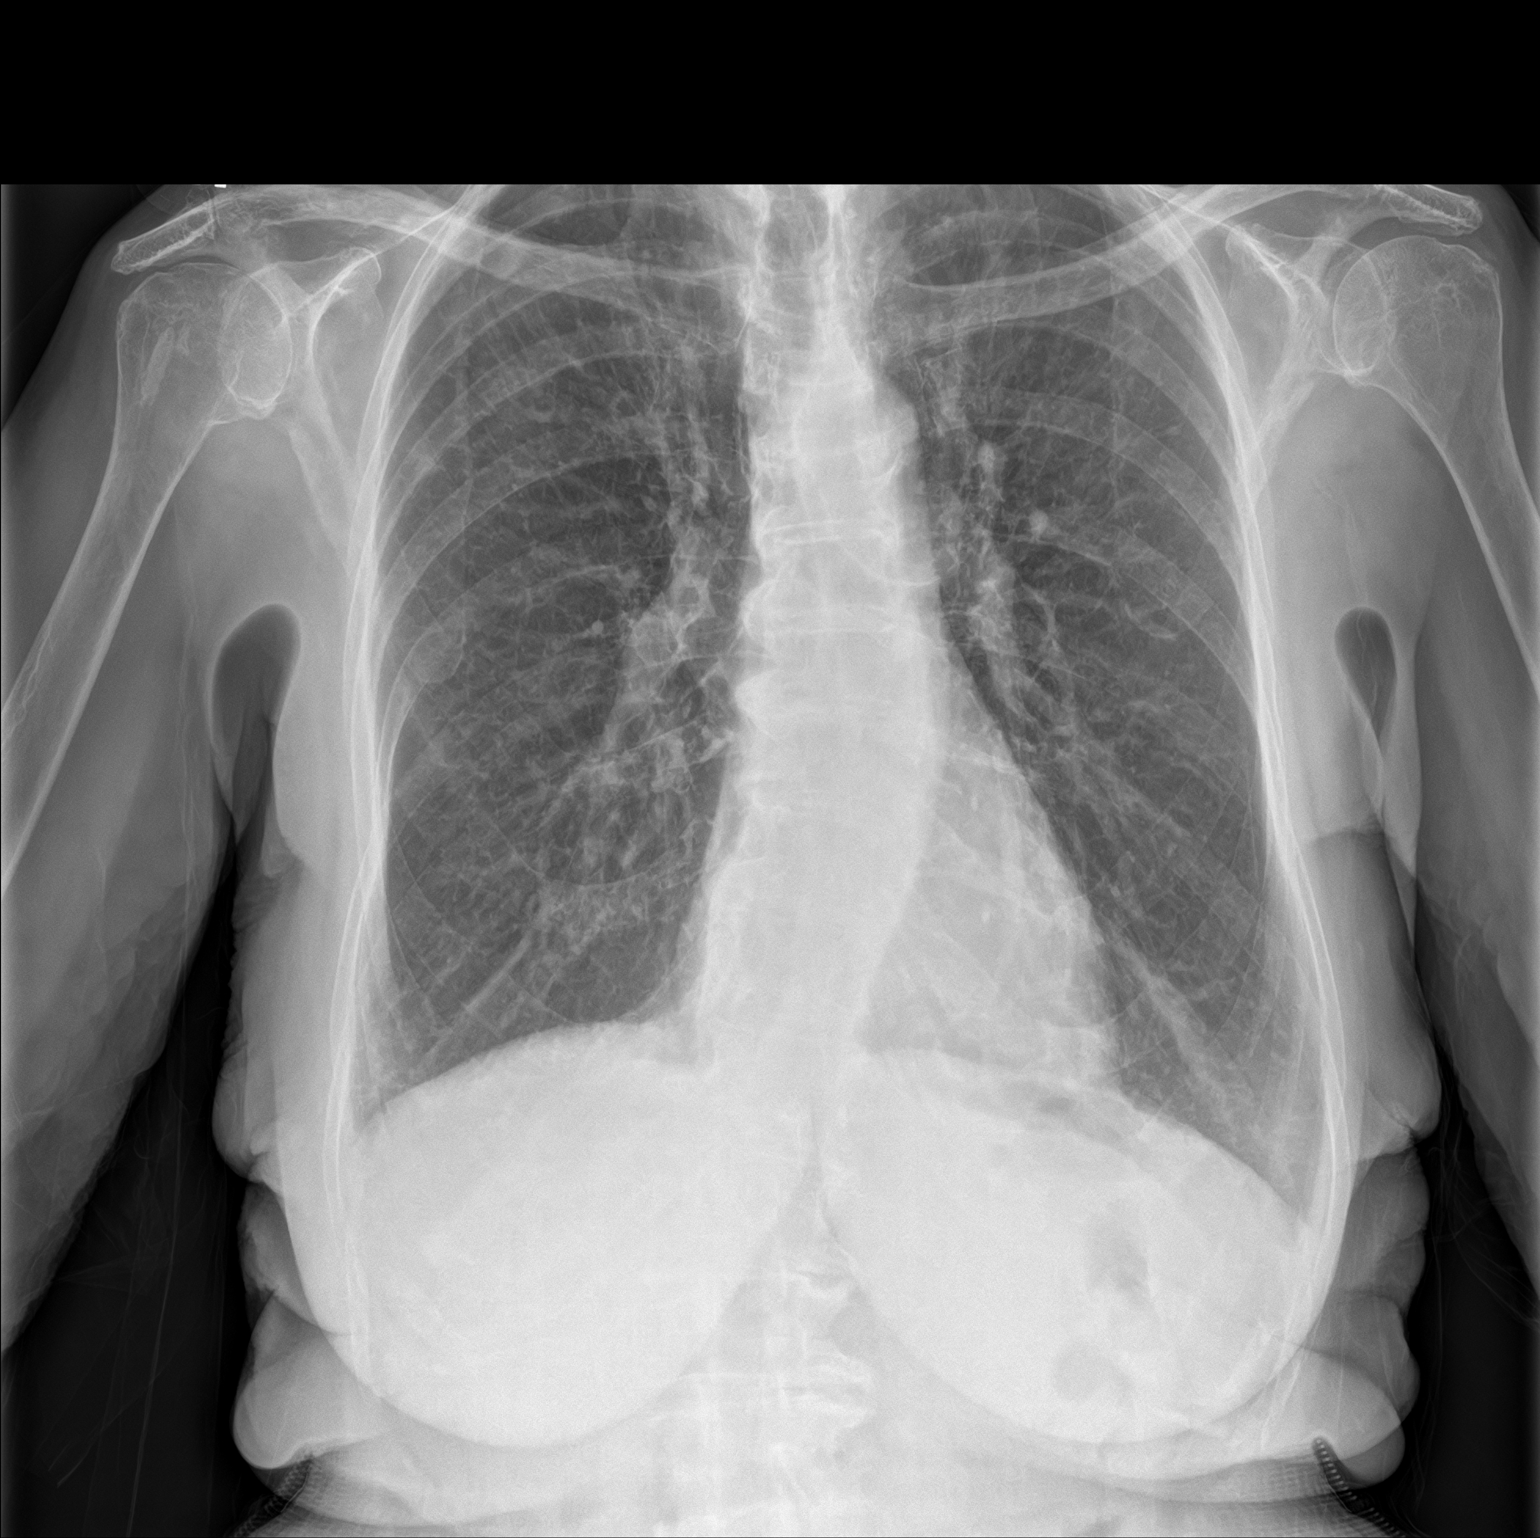
[im 2/2]
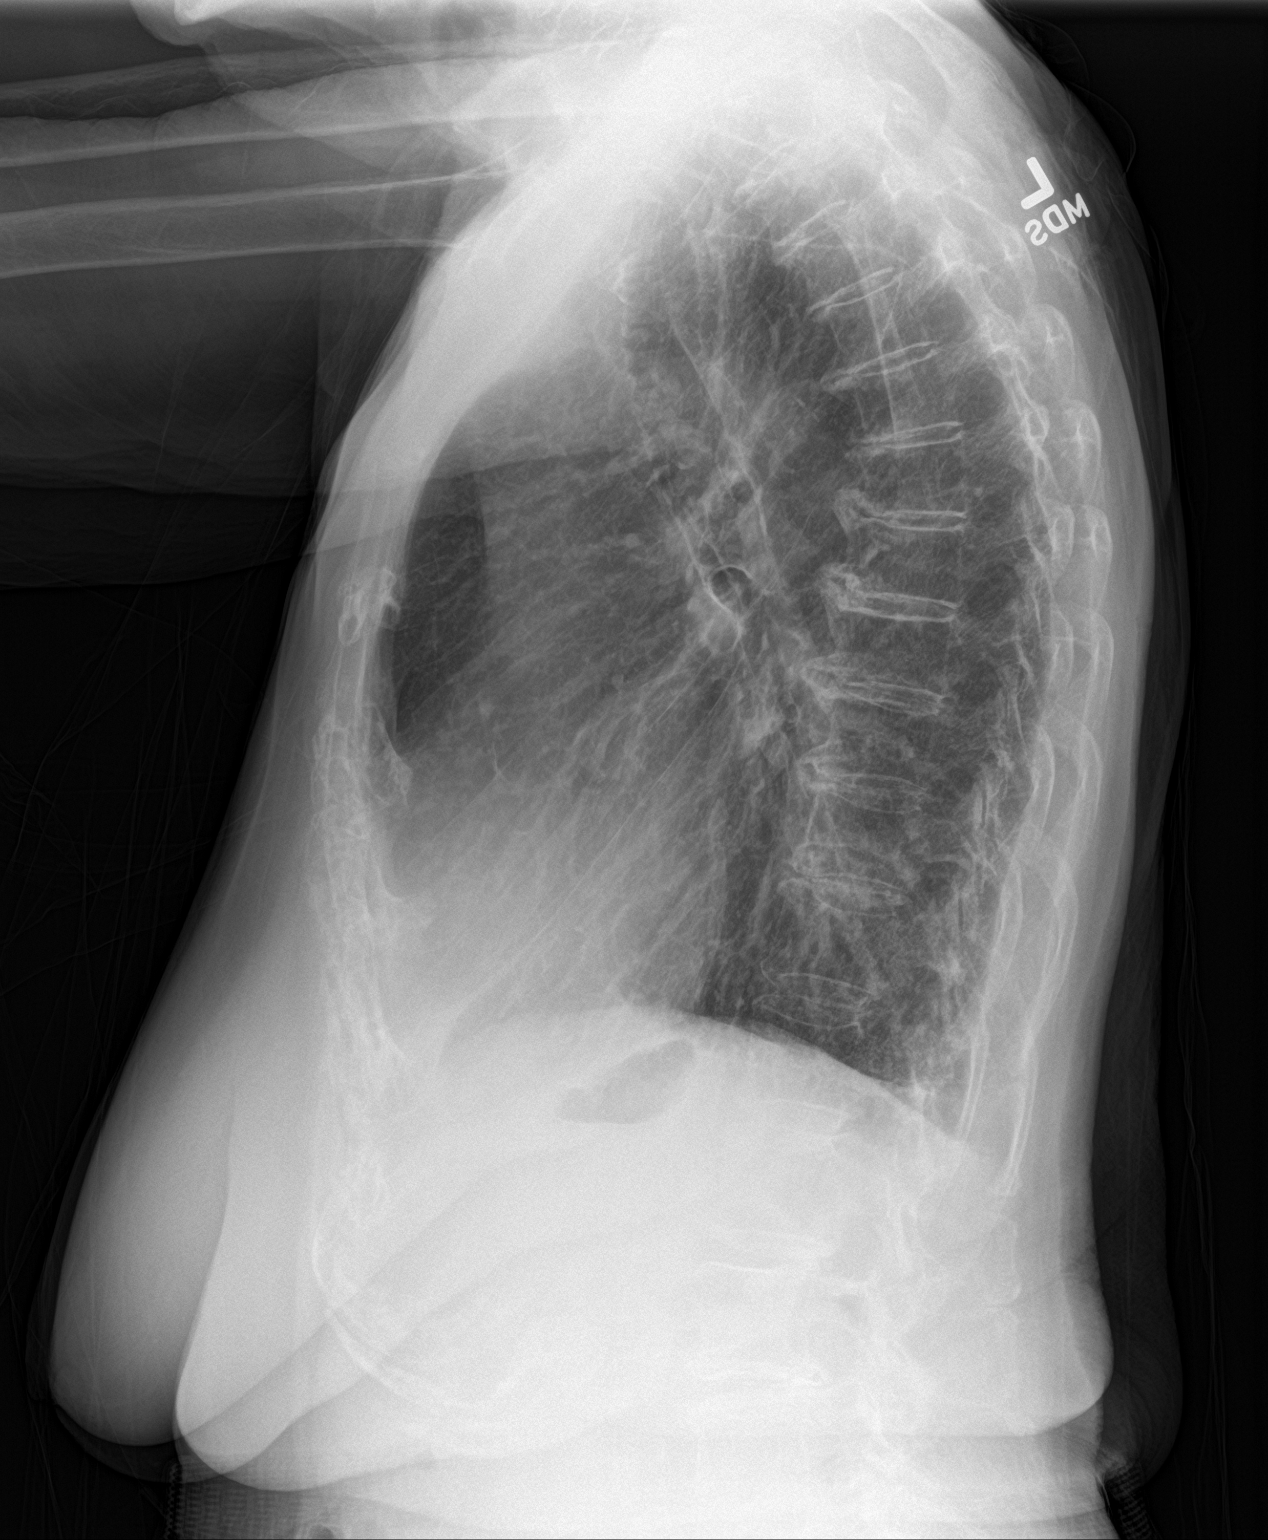

[2 of 2 positions shown; findings below may reference images not displayed]

FINDINGS: Lungs are hyperexpanded. There is scarring in each upper lung zone.
There is no edema or consolidation. Heart size and pulmonary
vascularity are normal. No adenopathy. There is aortic
atherosclerosis. There is thoracic levoscoliosis with degenerative
change in the thoracic spine. Bones are osteoporotic.
IMPRESSION: Lungs hyperexpanded with upper lobe scarring. No edema or
consolidation. Heart size normal. There is aortic atherosclerosis.
Bones are osteoporotic.

Aortic Atherosclerosis ([id number]).

## 2019-10-08 IMAGING — CT CT ANGIO CHEST
2 of 6 series · 19 of 46 positions shown · IV contrast (APPLIED)
Comparison: Chest x-ray April 12, 2018. CT of the chest November 05, 2012.

CLINICAL DATA: Chest pain with radiation to left arm and neck.
Weakness and shortness of breath.

EXAM:
CT ANGIOGRAPHY CHEST WITH CONTRAST
TECHNIQUE: Multidetector CT imaging of the chest was performed using the
standard protocol during bolus administration of intravenous
contrast. Multiplanar CT image reconstructions and MIPs were
obtained to evaluate the vascular anatomy.
CONTRAST:  75mL OMNIPAQUE IOHEXOL 350 MG/ML SOLN

[Series 5: thins · axial · 0.54mm/px · z∈[-736,-464]mm · 16 of 298 slices shown]
[im 13/298  lung]
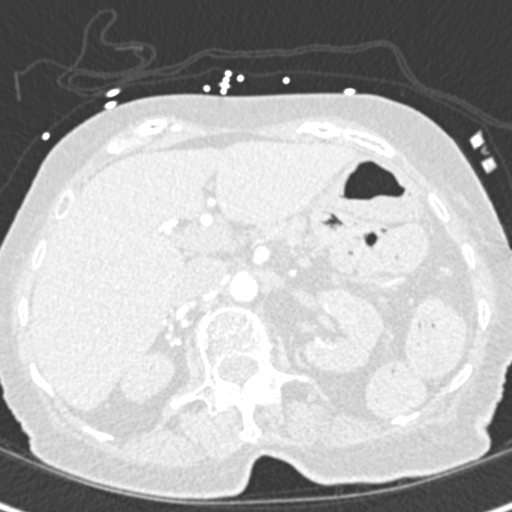
[im 39/298  soft-tissue]
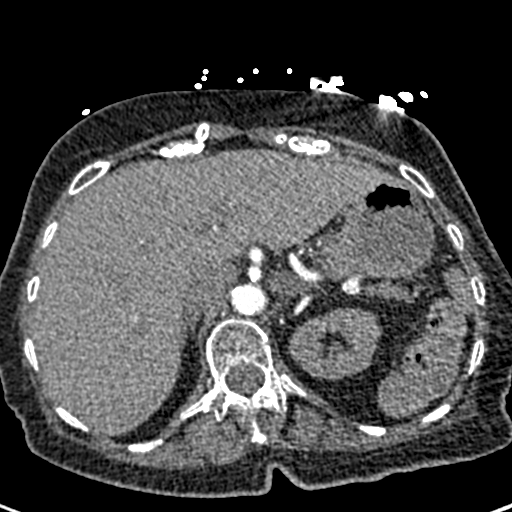
[im 52/298  lung]
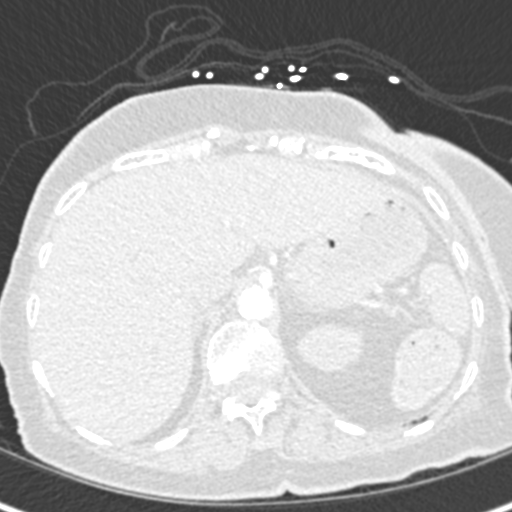
[im 65/298  soft-tissue]
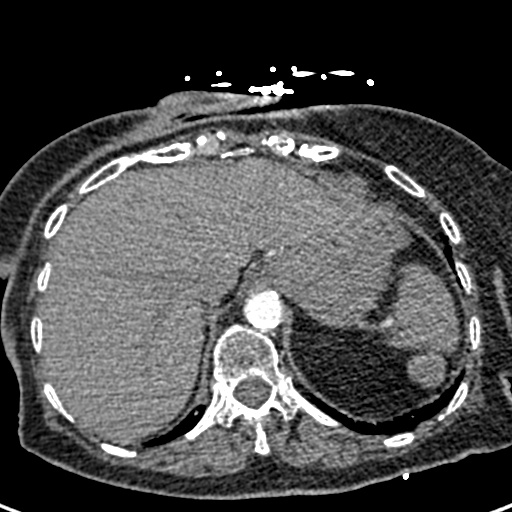
[im 91/298  lung]
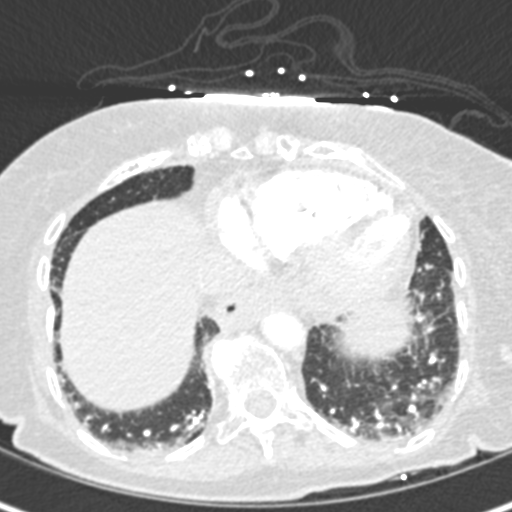
[im 104/298  soft-tissue]
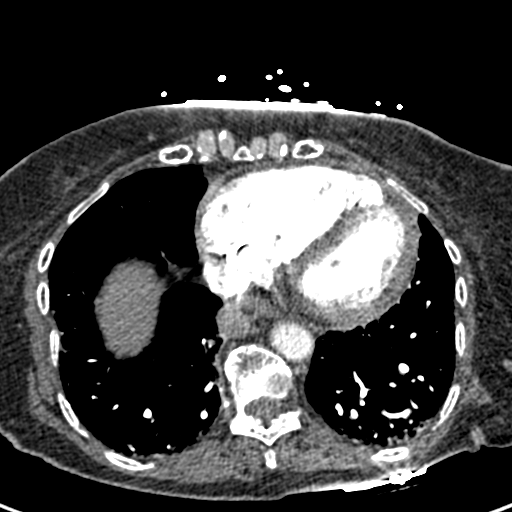
[im 117/298  lung]
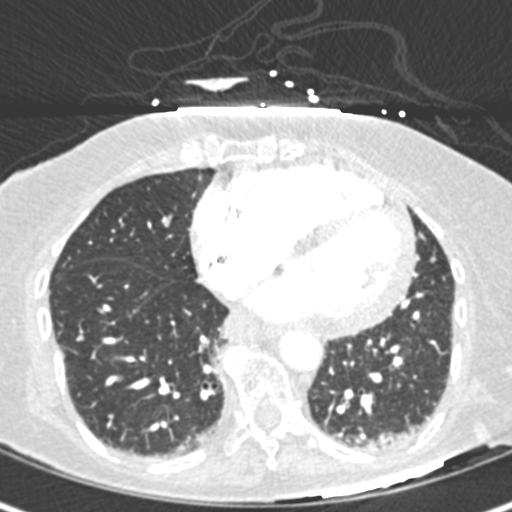
[im 143/298  soft-tissue]
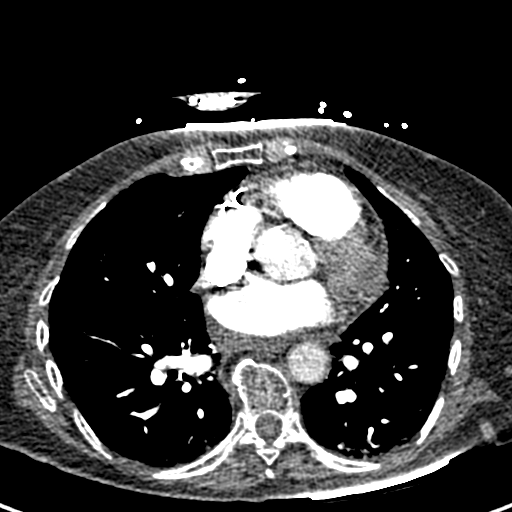
[im 155/298  lung]
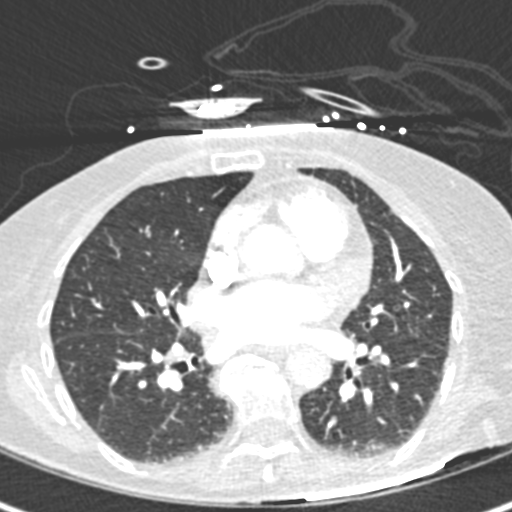
[im 181/298  soft-tissue]
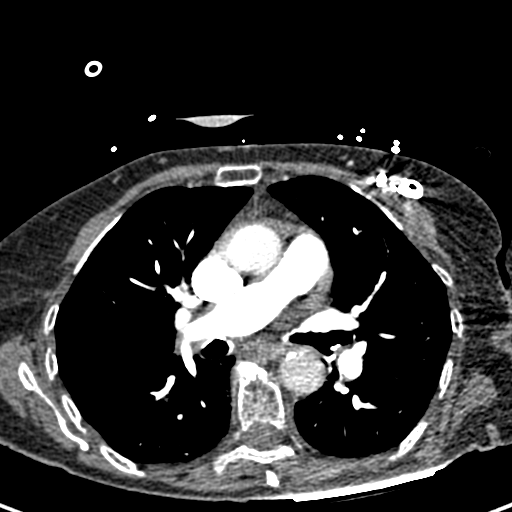
[im 194/298  lung]
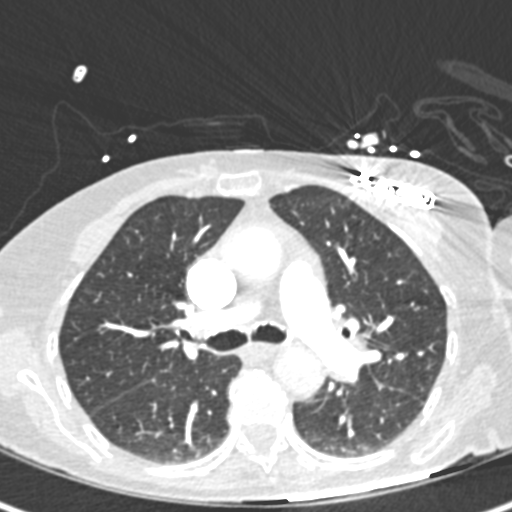
[im 207/298  soft-tissue]
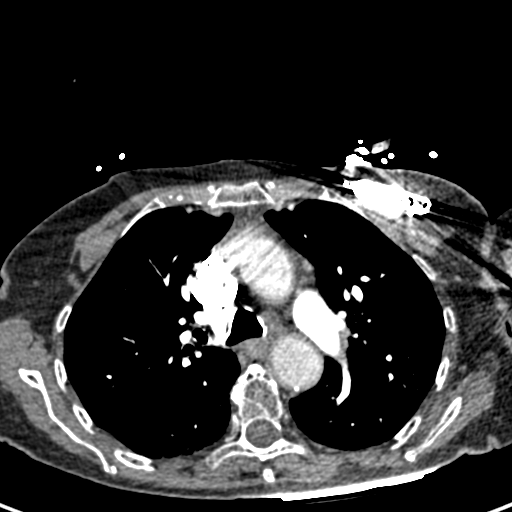
[im 233/298  lung]
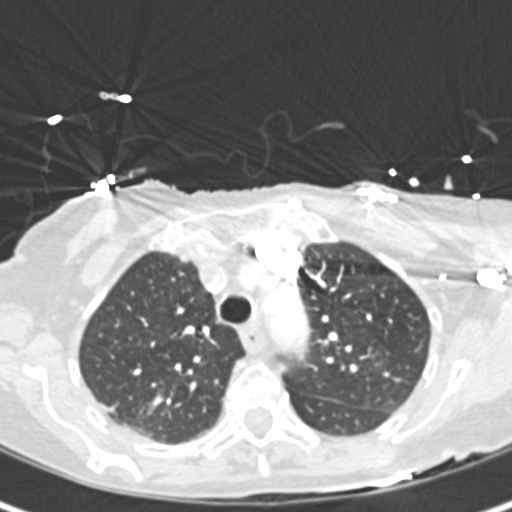
[im 246/298  soft-tissue]
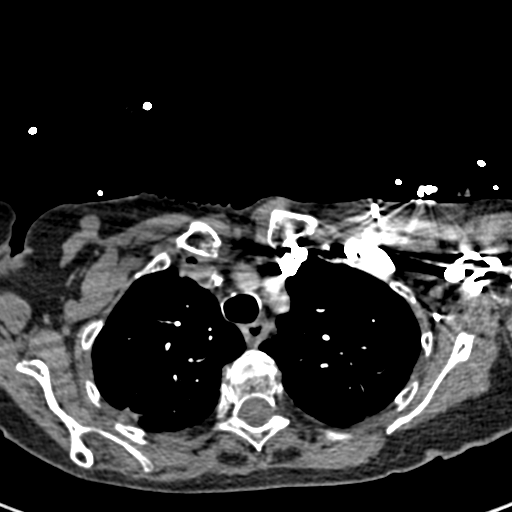
[im 259/298  lung]
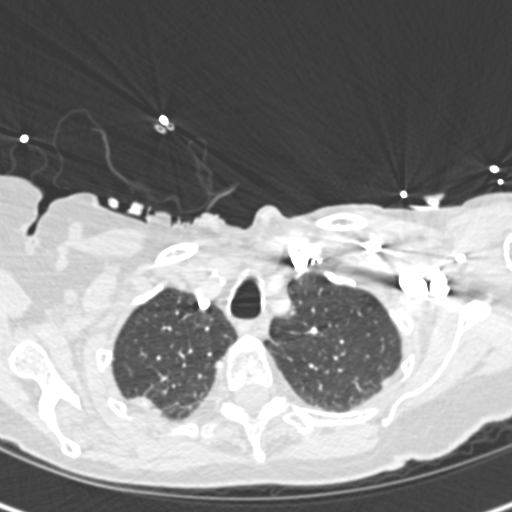
[im 285/298  soft-tissue]
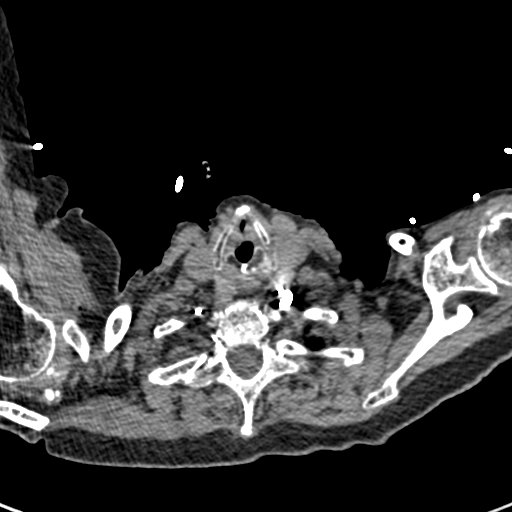

[Series 7: coronal mpr · coronal · 0.54mm/px · 3 of 73 slices shown]
[im 19/73  soft-tissue]
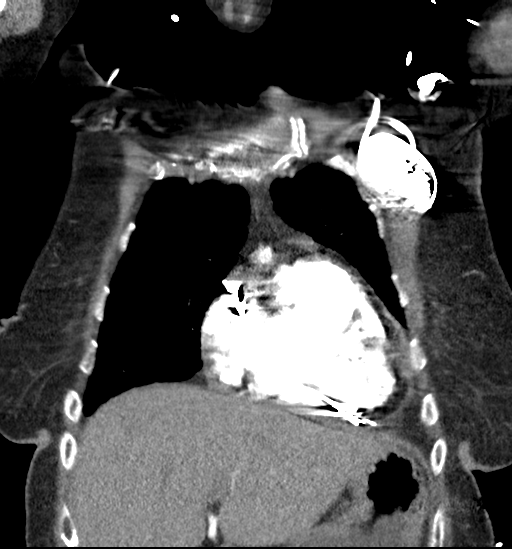
[im 37/73  soft-tissue]
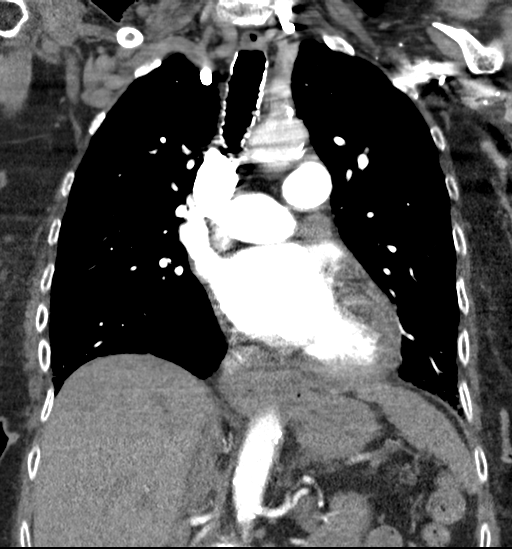
[im 55/73  soft-tissue]
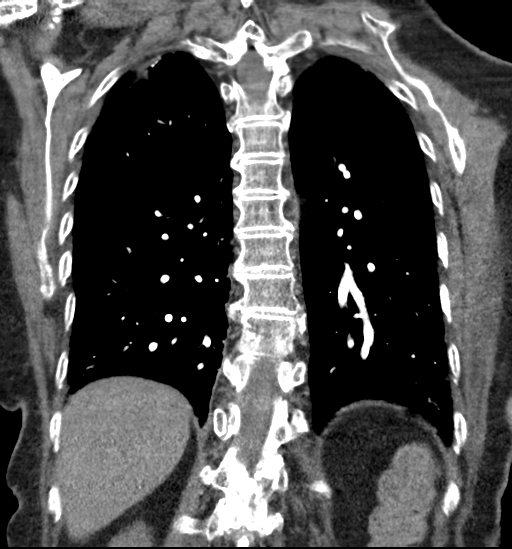

[19 of 46 positions shown; findings below may reference images not displayed]

FINDINGS: Cardiovascular: The heart size is borderline. The thoracic aorta
demonstrates atherosclerotic change but no aneurysm or dissection.
Coronary artery calcifications are noted. No pulmonary emboli.

Mediastinum/Nodes: No effusion. Suggested mild thickening of the
distal esophagus. No adenopathy. The pacemaker generator is seen in
the left anterior chest.

Lungs/Pleura: Central airways are normal. No pneumothorax. Mild
dependent atelectasis. No suspicious infiltrate, nodule, or mass.

Upper Abdomen: Thickening of the left adrenal gland is likely due to
hyperplasia. No other abnormalities in the upper abdomen.

Musculoskeletal: No chest wall abnormality. No acute or significant
osseous findings.

Review of the MIP images confirms the above findings.
IMPRESSION: 1. No pulmonary emboli.
2. Suggested thickening of the distal esophagus may represent
sequela of reflux or esophagitis.
3. Atherosclerotic change in the thoracic aorta. Coronary artery
calcifications.
4. Probable left adrenal hyperplasia.

Aortic Atherosclerosis ([id number]).

## 2019-10-08 IMAGING — DX DG CHEST 1V PORT
1 series · 1 of 1 positions shown · non-contrast
Comparison: Radiograph 02/25/2018

CLINICAL DATA: Chest pain.

EXAM:
PORTABLE CHEST 1 VIEW

[chest ap]
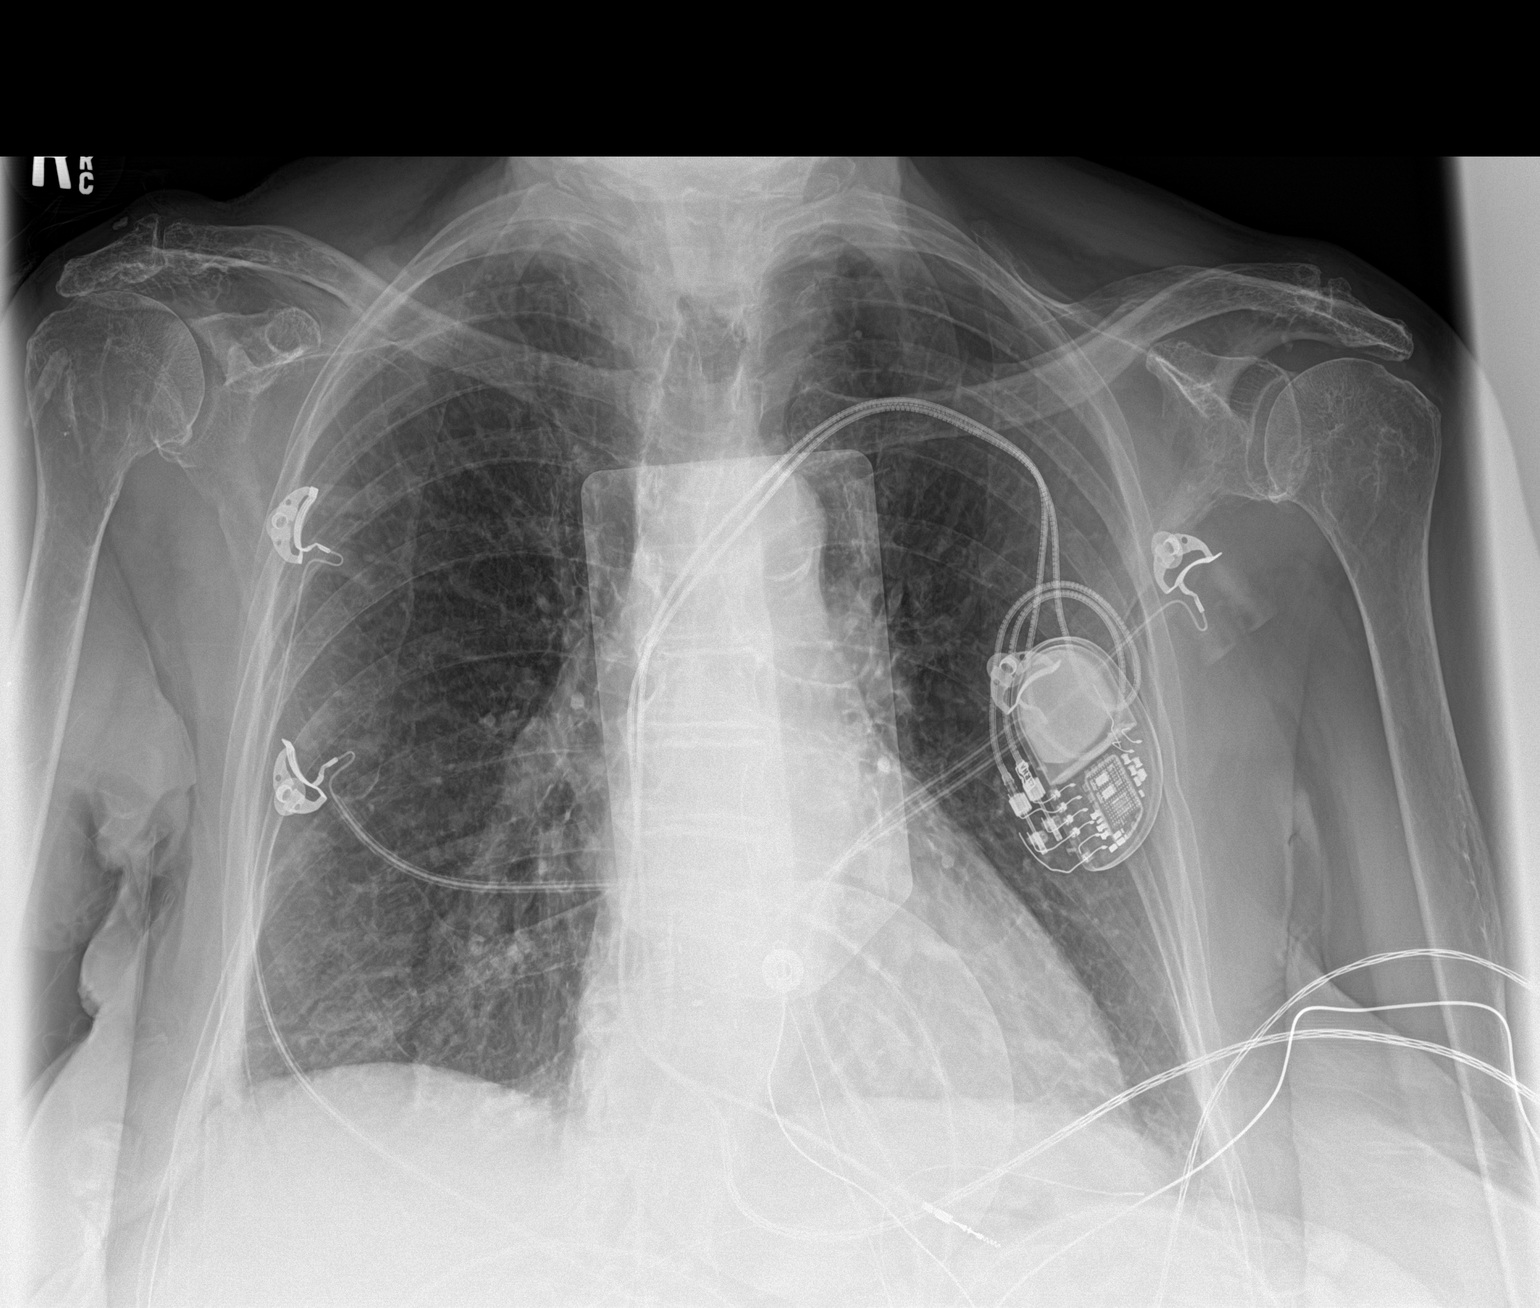

[1 of 1 positions shown; findings below may reference images not displayed]

FINDINGS: Left-sided pacemaker in place. Unchanged heart size and mediastinal
contours. Aortic atherosclerosis. Mild interstitial coarsening that
is chronic. No pulmonary edema, focal airspace disease, large
pleural effusion or pneumothorax. Bones are under mineralized.
IMPRESSION: No acute findings.

## 2019-10-09 IMAGING — DX DG CHEST 1V PORT
1 series · 1 of 1 positions shown · non-contrast
Comparison: Chest CTA dated 04/12/2018 and portable chest dated
04/12/2018.

CLINICAL DATA: Chest pain and hypoxia.

EXAM:
PORTABLE CHEST 1 VIEW

[chest ap]
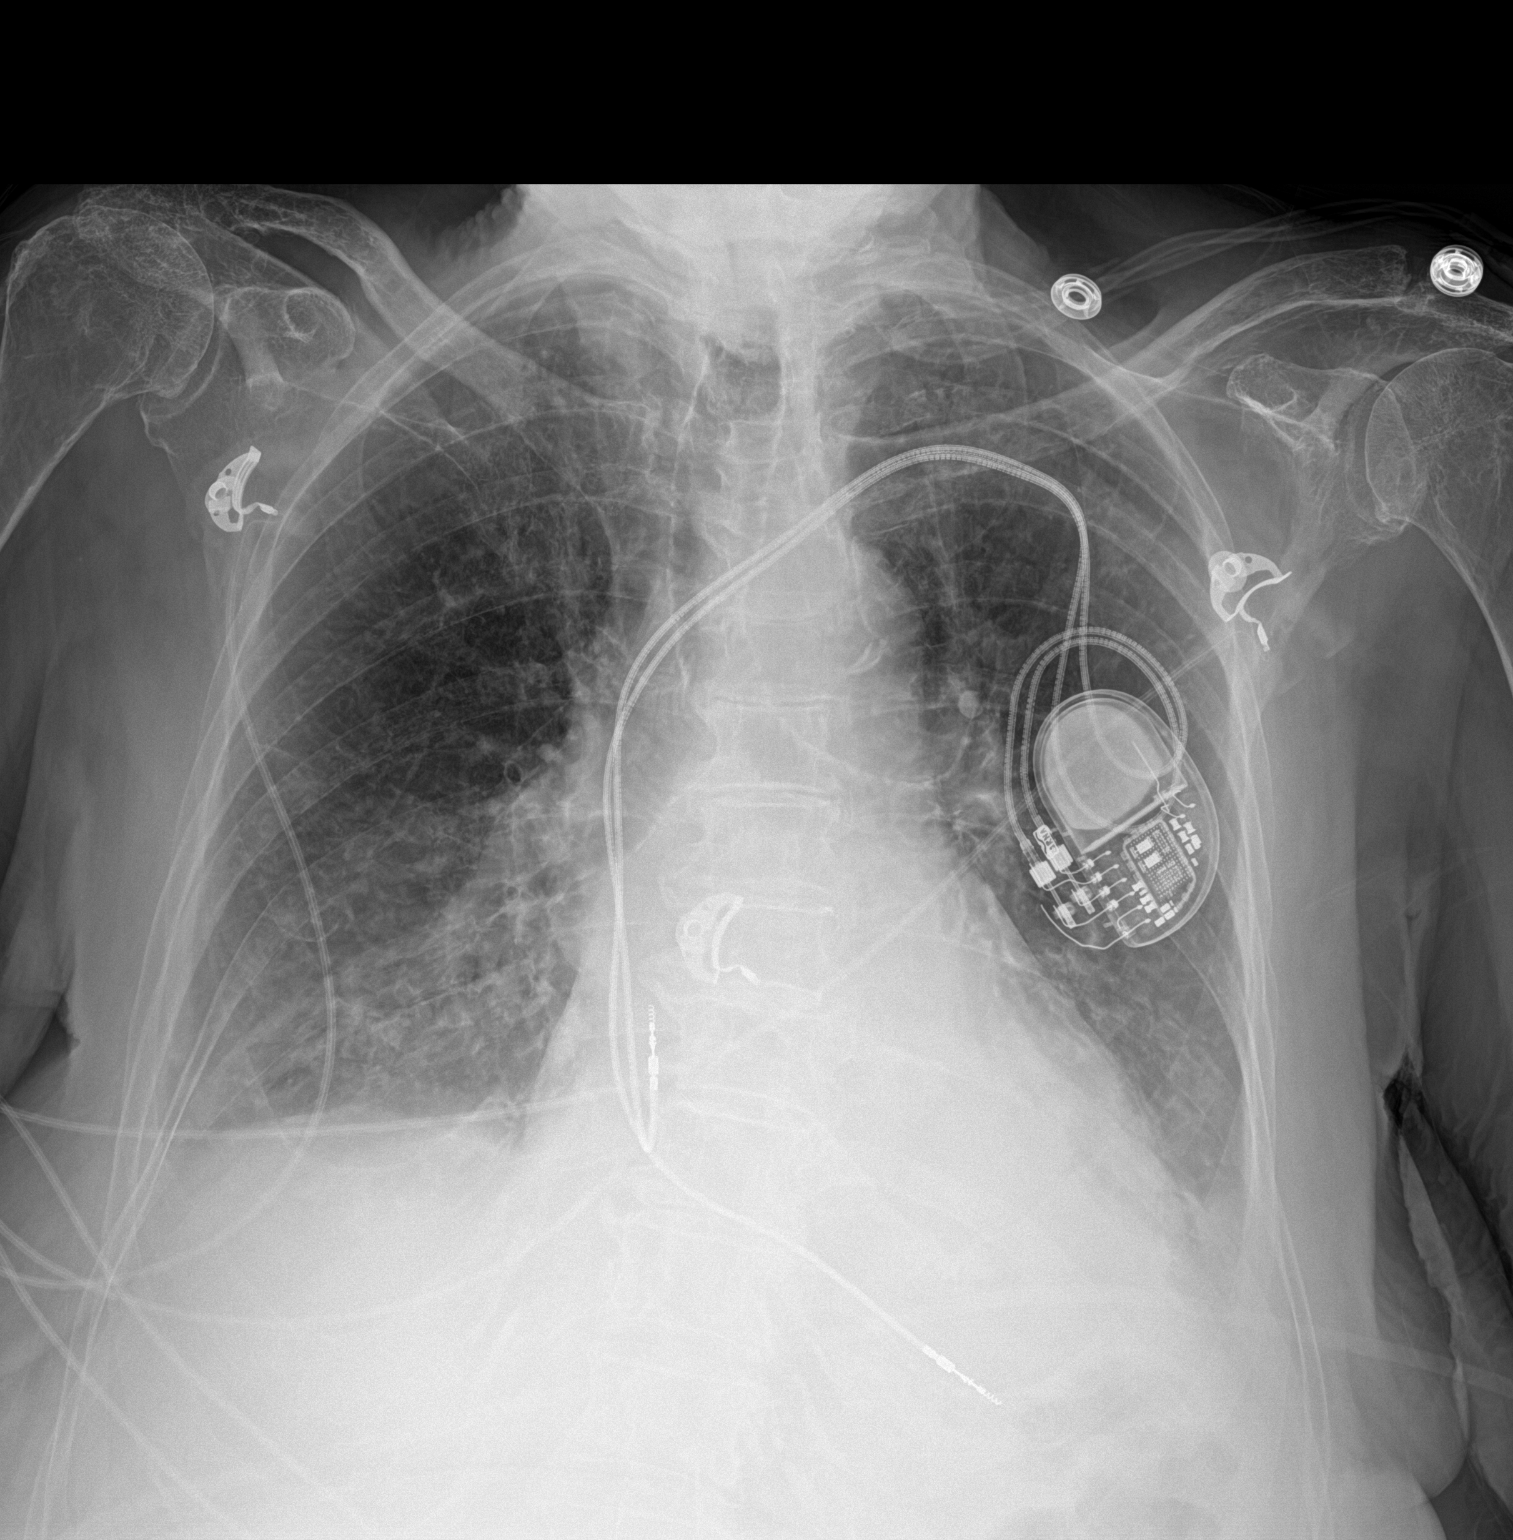

[1 of 1 positions shown; findings below may reference images not displayed]

FINDINGS: Interval mildly enlarged cardiac silhouette with increased
prominence of the pulmonary vasculature and interstitial markings,
including Kerley lines. Small bilateral pleural effusions. Stable
left subclavian pacemaker leads, thoracic spine degenerative changes
and bilateral shoulder degenerative changes. Diffuse osteopenia.
IMPRESSION: Interval changes of acute congestive heart failure.
